# Patient Record
Sex: Male | Born: 1945 | Race: White | Hispanic: No | Marital: Single | Smoking: Current every day smoker
Health system: Southern US, Community
[De-identification: ages and names within clinical notes are randomized; demographics above are authoritative.]

## PROBLEM LIST (undated history)

## (undated) DIAGNOSIS — I10 Essential (primary) hypertension: Secondary | ICD-10-CM

## (undated) DIAGNOSIS — E119 Type 2 diabetes mellitus without complications: Secondary | ICD-10-CM

## (undated) DIAGNOSIS — J449 Chronic obstructive pulmonary disease, unspecified: Secondary | ICD-10-CM

## (undated) DIAGNOSIS — N200 Calculus of kidney: Secondary | ICD-10-CM

## (undated) DIAGNOSIS — I2510 Atherosclerotic heart disease of native coronary artery without angina pectoris: Secondary | ICD-10-CM

## (undated) DIAGNOSIS — K219 Gastro-esophageal reflux disease without esophagitis: Secondary | ICD-10-CM

---

## 2007-08-01 ENCOUNTER — Ambulatory Visit: Payer: Self-pay | Admitting: Cardiology

## 2009-07-22 ENCOUNTER — Ambulatory Visit: Payer: Self-pay | Admitting: Cardiology

## 2011-03-13 DIAGNOSIS — Z79899 Other long term (current) drug therapy: Secondary | ICD-10-CM | POA: Diagnosis not present

## 2011-03-13 DIAGNOSIS — G8929 Other chronic pain: Secondary | ICD-10-CM | POA: Diagnosis not present

## 2011-03-13 DIAGNOSIS — R5381 Other malaise: Secondary | ICD-10-CM | POA: Diagnosis not present

## 2011-03-13 DIAGNOSIS — I10 Essential (primary) hypertension: Secondary | ICD-10-CM | POA: Diagnosis not present

## 2011-03-13 DIAGNOSIS — R5383 Other fatigue: Secondary | ICD-10-CM | POA: Diagnosis not present

## 2011-03-13 DIAGNOSIS — Z7982 Long term (current) use of aspirin: Secondary | ICD-10-CM | POA: Diagnosis not present

## 2011-03-13 DIAGNOSIS — K449 Diaphragmatic hernia without obstruction or gangrene: Secondary | ICD-10-CM | POA: Diagnosis not present

## 2011-03-13 DIAGNOSIS — R079 Chest pain, unspecified: Secondary | ICD-10-CM | POA: Diagnosis not present

## 2011-03-13 DIAGNOSIS — N390 Urinary tract infection, site not specified: Secondary | ICD-10-CM | POA: Diagnosis not present

## 2011-03-13 DIAGNOSIS — M549 Dorsalgia, unspecified: Secondary | ICD-10-CM | POA: Diagnosis not present

## 2011-03-13 DIAGNOSIS — E860 Dehydration: Secondary | ICD-10-CM | POA: Diagnosis not present

## 2011-03-13 DIAGNOSIS — J449 Chronic obstructive pulmonary disease, unspecified: Secondary | ICD-10-CM | POA: Diagnosis not present

## 2011-03-13 DIAGNOSIS — E119 Type 2 diabetes mellitus without complications: Secondary | ICD-10-CM | POA: Diagnosis not present

## 2011-03-13 DIAGNOSIS — F17200 Nicotine dependence, unspecified, uncomplicated: Secondary | ICD-10-CM | POA: Diagnosis not present

## 2011-03-13 DIAGNOSIS — E785 Hyperlipidemia, unspecified: Secondary | ICD-10-CM | POA: Diagnosis not present

## 2011-03-13 DIAGNOSIS — Z8673 Personal history of transient ischemic attack (TIA), and cerebral infarction without residual deficits: Secondary | ICD-10-CM | POA: Diagnosis not present

## 2011-03-14 DIAGNOSIS — J449 Chronic obstructive pulmonary disease, unspecified: Secondary | ICD-10-CM | POA: Diagnosis not present

## 2011-03-14 DIAGNOSIS — Z8673 Personal history of transient ischemic attack (TIA), and cerebral infarction without residual deficits: Secondary | ICD-10-CM | POA: Diagnosis not present

## 2011-03-14 DIAGNOSIS — N390 Urinary tract infection, site not specified: Secondary | ICD-10-CM | POA: Diagnosis not present

## 2011-03-14 DIAGNOSIS — M549 Dorsalgia, unspecified: Secondary | ICD-10-CM | POA: Diagnosis not present

## 2011-03-14 DIAGNOSIS — E119 Type 2 diabetes mellitus without complications: Secondary | ICD-10-CM | POA: Diagnosis not present

## 2011-03-14 DIAGNOSIS — E860 Dehydration: Secondary | ICD-10-CM | POA: Diagnosis not present

## 2011-03-15 DIAGNOSIS — N390 Urinary tract infection, site not specified: Secondary | ICD-10-CM | POA: Diagnosis not present

## 2011-03-15 DIAGNOSIS — E119 Type 2 diabetes mellitus without complications: Secondary | ICD-10-CM | POA: Diagnosis not present

## 2011-03-15 DIAGNOSIS — J449 Chronic obstructive pulmonary disease, unspecified: Secondary | ICD-10-CM | POA: Diagnosis not present

## 2011-03-15 DIAGNOSIS — Z8673 Personal history of transient ischemic attack (TIA), and cerebral infarction without residual deficits: Secondary | ICD-10-CM | POA: Diagnosis not present

## 2011-03-15 DIAGNOSIS — M549 Dorsalgia, unspecified: Secondary | ICD-10-CM | POA: Diagnosis not present

## 2011-03-15 DIAGNOSIS — E860 Dehydration: Secondary | ICD-10-CM | POA: Diagnosis not present

## 2011-03-16 DIAGNOSIS — E860 Dehydration: Secondary | ICD-10-CM | POA: Diagnosis not present

## 2011-03-16 DIAGNOSIS — N390 Urinary tract infection, site not specified: Secondary | ICD-10-CM | POA: Diagnosis not present

## 2011-03-16 DIAGNOSIS — J449 Chronic obstructive pulmonary disease, unspecified: Secondary | ICD-10-CM | POA: Diagnosis not present

## 2011-03-16 DIAGNOSIS — M549 Dorsalgia, unspecified: Secondary | ICD-10-CM | POA: Diagnosis not present

## 2011-03-16 DIAGNOSIS — Z8673 Personal history of transient ischemic attack (TIA), and cerebral infarction without residual deficits: Secondary | ICD-10-CM | POA: Diagnosis not present

## 2011-03-16 DIAGNOSIS — E119 Type 2 diabetes mellitus without complications: Secondary | ICD-10-CM | POA: Diagnosis not present

## 2011-04-03 DIAGNOSIS — G609 Hereditary and idiopathic neuropathy, unspecified: Secondary | ICD-10-CM | POA: Diagnosis not present

## 2011-04-03 DIAGNOSIS — M549 Dorsalgia, unspecified: Secondary | ICD-10-CM | POA: Diagnosis not present

## 2011-04-03 DIAGNOSIS — K219 Gastro-esophageal reflux disease without esophagitis: Secondary | ICD-10-CM | POA: Diagnosis not present

## 2011-04-03 DIAGNOSIS — J449 Chronic obstructive pulmonary disease, unspecified: Secondary | ICD-10-CM | POA: Diagnosis not present

## 2011-04-03 DIAGNOSIS — I6529 Occlusion and stenosis of unspecified carotid artery: Secondary | ICD-10-CM | POA: Diagnosis not present

## 2011-04-03 DIAGNOSIS — F411 Generalized anxiety disorder: Secondary | ICD-10-CM | POA: Diagnosis not present

## 2011-06-01 DIAGNOSIS — F411 Generalized anxiety disorder: Secondary | ICD-10-CM | POA: Diagnosis not present

## 2011-06-01 DIAGNOSIS — Z833 Family history of diabetes mellitus: Secondary | ICD-10-CM | POA: Diagnosis not present

## 2011-06-01 DIAGNOSIS — I6529 Occlusion and stenosis of unspecified carotid artery: Secondary | ICD-10-CM | POA: Diagnosis not present

## 2011-06-01 DIAGNOSIS — E785 Hyperlipidemia, unspecified: Secondary | ICD-10-CM | POA: Diagnosis not present

## 2011-06-01 DIAGNOSIS — J449 Chronic obstructive pulmonary disease, unspecified: Secondary | ICD-10-CM | POA: Diagnosis not present

## 2011-06-01 DIAGNOSIS — E119 Type 2 diabetes mellitus without complications: Secondary | ICD-10-CM | POA: Diagnosis not present

## 2011-06-01 DIAGNOSIS — E782 Mixed hyperlipidemia: Secondary | ICD-10-CM | POA: Diagnosis not present

## 2011-06-01 DIAGNOSIS — K219 Gastro-esophageal reflux disease without esophagitis: Secondary | ICD-10-CM | POA: Diagnosis not present

## 2011-06-01 DIAGNOSIS — M549 Dorsalgia, unspecified: Secondary | ICD-10-CM | POA: Diagnosis not present

## 2011-06-01 DIAGNOSIS — G609 Hereditary and idiopathic neuropathy, unspecified: Secondary | ICD-10-CM | POA: Diagnosis not present

## 2011-06-18 DIAGNOSIS — M545 Low back pain, unspecified: Secondary | ICD-10-CM | POA: Diagnosis not present

## 2011-06-18 DIAGNOSIS — Z7982 Long term (current) use of aspirin: Secondary | ICD-10-CM | POA: Diagnosis not present

## 2011-06-18 DIAGNOSIS — E119 Type 2 diabetes mellitus without complications: Secondary | ICD-10-CM | POA: Diagnosis not present

## 2011-06-18 DIAGNOSIS — I714 Abdominal aortic aneurysm, without rupture: Secondary | ICD-10-CM | POA: Diagnosis not present

## 2011-06-18 DIAGNOSIS — G8929 Other chronic pain: Secondary | ICD-10-CM | POA: Diagnosis not present

## 2011-06-18 DIAGNOSIS — N281 Cyst of kidney, acquired: Secondary | ICD-10-CM | POA: Diagnosis not present

## 2011-06-18 DIAGNOSIS — Z79899 Other long term (current) drug therapy: Secondary | ICD-10-CM | POA: Diagnosis not present

## 2011-06-18 DIAGNOSIS — M549 Dorsalgia, unspecified: Secondary | ICD-10-CM | POA: Diagnosis not present

## 2011-06-18 DIAGNOSIS — F17200 Nicotine dependence, unspecified, uncomplicated: Secondary | ICD-10-CM | POA: Diagnosis not present

## 2011-06-18 DIAGNOSIS — J449 Chronic obstructive pulmonary disease, unspecified: Secondary | ICD-10-CM | POA: Diagnosis not present

## 2011-06-18 DIAGNOSIS — N200 Calculus of kidney: Secondary | ICD-10-CM | POA: Diagnosis not present

## 2011-06-18 DIAGNOSIS — R109 Unspecified abdominal pain: Secondary | ICD-10-CM | POA: Diagnosis not present

## 2011-07-16 DIAGNOSIS — I10 Essential (primary) hypertension: Secondary | ICD-10-CM | POA: Diagnosis not present

## 2011-07-16 DIAGNOSIS — G319 Degenerative disease of nervous system, unspecified: Secondary | ICD-10-CM | POA: Diagnosis not present

## 2011-07-16 DIAGNOSIS — F17200 Nicotine dependence, unspecified, uncomplicated: Secondary | ICD-10-CM | POA: Diagnosis not present

## 2011-07-16 DIAGNOSIS — I6529 Occlusion and stenosis of unspecified carotid artery: Secondary | ICD-10-CM | POA: Diagnosis not present

## 2011-07-16 DIAGNOSIS — Z2821 Immunization not carried out because of patient refusal: Secondary | ICD-10-CM | POA: Diagnosis not present

## 2011-07-16 DIAGNOSIS — K449 Diaphragmatic hernia without obstruction or gangrene: Secondary | ICD-10-CM | POA: Diagnosis not present

## 2011-07-16 DIAGNOSIS — Z79899 Other long term (current) drug therapy: Secondary | ICD-10-CM | POA: Diagnosis not present

## 2011-07-16 DIAGNOSIS — M545 Low back pain, unspecified: Secondary | ICD-10-CM | POA: Diagnosis not present

## 2011-07-16 DIAGNOSIS — IMO0002 Reserved for concepts with insufficient information to code with codable children: Secondary | ICD-10-CM | POA: Diagnosis not present

## 2011-07-16 DIAGNOSIS — N289 Disorder of kidney and ureter, unspecified: Secondary | ICD-10-CM | POA: Diagnosis not present

## 2011-07-16 DIAGNOSIS — G459 Transient cerebral ischemic attack, unspecified: Secondary | ICD-10-CM | POA: Diagnosis not present

## 2011-07-16 DIAGNOSIS — E785 Hyperlipidemia, unspecified: Secondary | ICD-10-CM | POA: Diagnosis not present

## 2011-07-16 DIAGNOSIS — E119 Type 2 diabetes mellitus without complications: Secondary | ICD-10-CM | POA: Diagnosis not present

## 2011-07-16 DIAGNOSIS — G8929 Other chronic pain: Secondary | ICD-10-CM | POA: Diagnosis not present

## 2011-07-16 DIAGNOSIS — J449 Chronic obstructive pulmonary disease, unspecified: Secondary | ICD-10-CM | POA: Diagnosis not present

## 2011-07-16 DIAGNOSIS — Z7982 Long term (current) use of aspirin: Secondary | ICD-10-CM | POA: Diagnosis not present

## 2011-07-17 DIAGNOSIS — N289 Disorder of kidney and ureter, unspecified: Secondary | ICD-10-CM | POA: Diagnosis not present

## 2011-07-17 DIAGNOSIS — G459 Transient cerebral ischemic attack, unspecified: Secondary | ICD-10-CM | POA: Diagnosis not present

## 2011-07-17 DIAGNOSIS — I10 Essential (primary) hypertension: Secondary | ICD-10-CM | POA: Diagnosis not present

## 2011-07-17 DIAGNOSIS — E119 Type 2 diabetes mellitus without complications: Secondary | ICD-10-CM | POA: Diagnosis not present

## 2011-07-17 DIAGNOSIS — E785 Hyperlipidemia, unspecified: Secondary | ICD-10-CM | POA: Diagnosis not present

## 2011-07-17 DIAGNOSIS — J449 Chronic obstructive pulmonary disease, unspecified: Secondary | ICD-10-CM | POA: Diagnosis not present

## 2011-08-10 DIAGNOSIS — E1149 Type 2 diabetes mellitus with other diabetic neurological complication: Secondary | ICD-10-CM | POA: Diagnosis not present

## 2011-10-13 DIAGNOSIS — E782 Mixed hyperlipidemia: Secondary | ICD-10-CM | POA: Diagnosis not present

## 2011-10-13 DIAGNOSIS — E119 Type 2 diabetes mellitus without complications: Secondary | ICD-10-CM | POA: Diagnosis not present

## 2011-10-13 DIAGNOSIS — M549 Dorsalgia, unspecified: Secondary | ICD-10-CM | POA: Diagnosis not present

## 2011-11-27 DIAGNOSIS — E119 Type 2 diabetes mellitus without complications: Secondary | ICD-10-CM | POA: Diagnosis not present

## 2011-12-03 DIAGNOSIS — Z7982 Long term (current) use of aspirin: Secondary | ICD-10-CM | POA: Diagnosis not present

## 2011-12-03 DIAGNOSIS — F17200 Nicotine dependence, unspecified, uncomplicated: Secondary | ICD-10-CM | POA: Diagnosis not present

## 2011-12-03 DIAGNOSIS — Z79899 Other long term (current) drug therapy: Secondary | ICD-10-CM | POA: Diagnosis not present

## 2011-12-03 DIAGNOSIS — E119 Type 2 diabetes mellitus without complications: Secondary | ICD-10-CM | POA: Diagnosis not present

## 2011-12-03 DIAGNOSIS — N200 Calculus of kidney: Secondary | ICD-10-CM | POA: Diagnosis not present

## 2011-12-03 DIAGNOSIS — R109 Unspecified abdominal pain: Secondary | ICD-10-CM | POA: Diagnosis not present

## 2011-12-03 DIAGNOSIS — J449 Chronic obstructive pulmonary disease, unspecified: Secondary | ICD-10-CM | POA: Diagnosis not present

## 2011-12-08 DIAGNOSIS — N23 Unspecified renal colic: Secondary | ICD-10-CM | POA: Diagnosis not present

## 2011-12-23 ENCOUNTER — Ambulatory Visit (HOSPITAL_COMMUNITY)
Admission: RE | Admit: 2011-12-23 | Discharge: 2011-12-23 | Disposition: A | Discharge status: Home / Self Care | Payer: Medicare Other | Source: Ambulatory Visit | Attending: Urology | Admitting: Urology

## 2011-12-23 ENCOUNTER — Other Ambulatory Visit (HOSPITAL_COMMUNITY): Payer: Self-pay | Admitting: Urology

## 2011-12-23 DIAGNOSIS — N200 Calculus of kidney: Secondary | ICD-10-CM

## 2011-12-23 DIAGNOSIS — R1032 Left lower quadrant pain: Secondary | ICD-10-CM | POA: Diagnosis not present

## 2011-12-23 DIAGNOSIS — N320 Bladder-neck obstruction: Secondary | ICD-10-CM | POA: Diagnosis not present

## 2011-12-23 DIAGNOSIS — N23 Unspecified renal colic: Secondary | ICD-10-CM | POA: Diagnosis not present

## 2011-12-29 DIAGNOSIS — Z79899 Other long term (current) drug therapy: Secondary | ICD-10-CM | POA: Diagnosis not present

## 2011-12-29 DIAGNOSIS — E119 Type 2 diabetes mellitus without complications: Secondary | ICD-10-CM | POA: Diagnosis not present

## 2011-12-29 DIAGNOSIS — F17200 Nicotine dependence, unspecified, uncomplicated: Secondary | ICD-10-CM | POA: Diagnosis not present

## 2011-12-29 DIAGNOSIS — M129 Arthropathy, unspecified: Secondary | ICD-10-CM | POA: Diagnosis not present

## 2011-12-29 DIAGNOSIS — J449 Chronic obstructive pulmonary disease, unspecified: Secondary | ICD-10-CM | POA: Diagnosis not present

## 2011-12-29 DIAGNOSIS — N323 Diverticulum of bladder: Secondary | ICD-10-CM | POA: Diagnosis not present

## 2011-12-29 DIAGNOSIS — N200 Calculus of kidney: Secondary | ICD-10-CM | POA: Diagnosis not present

## 2012-01-06 DIAGNOSIS — N200 Calculus of kidney: Secondary | ICD-10-CM | POA: Diagnosis not present

## 2012-02-08 DIAGNOSIS — M549 Dorsalgia, unspecified: Secondary | ICD-10-CM | POA: Diagnosis not present

## 2012-04-08 DIAGNOSIS — E782 Mixed hyperlipidemia: Secondary | ICD-10-CM | POA: Diagnosis not present

## 2012-04-08 DIAGNOSIS — E119 Type 2 diabetes mellitus without complications: Secondary | ICD-10-CM | POA: Diagnosis not present

## 2012-04-08 DIAGNOSIS — M549 Dorsalgia, unspecified: Secondary | ICD-10-CM | POA: Diagnosis not present

## 2012-06-02 DIAGNOSIS — G43909 Migraine, unspecified, not intractable, without status migrainosus: Secondary | ICD-10-CM | POA: Diagnosis not present

## 2012-08-05 DIAGNOSIS — M549 Dorsalgia, unspecified: Secondary | ICD-10-CM | POA: Diagnosis not present

## 2012-10-03 DIAGNOSIS — Z125 Encounter for screening for malignant neoplasm of prostate: Secondary | ICD-10-CM | POA: Diagnosis not present

## 2012-10-03 DIAGNOSIS — M549 Dorsalgia, unspecified: Secondary | ICD-10-CM | POA: Diagnosis not present

## 2012-10-03 DIAGNOSIS — Z0000 Encounter for general adult medical examination without abnormal findings: Secondary | ICD-10-CM | POA: Diagnosis not present

## 2012-11-20 DIAGNOSIS — E669 Obesity, unspecified: Secondary | ICD-10-CM | POA: Diagnosis not present

## 2012-11-20 DIAGNOSIS — E119 Type 2 diabetes mellitus without complications: Secondary | ICD-10-CM | POA: Diagnosis not present

## 2012-11-20 DIAGNOSIS — Y92009 Unspecified place in unspecified non-institutional (private) residence as the place of occurrence of the external cause: Secondary | ICD-10-CM | POA: Diagnosis not present

## 2012-11-20 DIAGNOSIS — Z79899 Other long term (current) drug therapy: Secondary | ICD-10-CM | POA: Diagnosis not present

## 2012-11-20 DIAGNOSIS — J449 Chronic obstructive pulmonary disease, unspecified: Secondary | ICD-10-CM | POA: Diagnosis not present

## 2012-11-20 DIAGNOSIS — S61209A Unspecified open wound of unspecified finger without damage to nail, initial encounter: Secondary | ICD-10-CM | POA: Diagnosis not present

## 2012-11-20 DIAGNOSIS — F17200 Nicotine dependence, unspecified, uncomplicated: Secondary | ICD-10-CM | POA: Diagnosis not present

## 2012-11-20 DIAGNOSIS — Z23 Encounter for immunization: Secondary | ICD-10-CM | POA: Diagnosis not present

## 2012-11-20 DIAGNOSIS — W268XXA Contact with other sharp object(s), not elsewhere classified, initial encounter: Secondary | ICD-10-CM | POA: Diagnosis not present

## 2012-11-20 DIAGNOSIS — Z7982 Long term (current) use of aspirin: Secondary | ICD-10-CM | POA: Diagnosis not present

## 2012-12-05 DIAGNOSIS — E119 Type 2 diabetes mellitus without complications: Secondary | ICD-10-CM | POA: Diagnosis not present

## 2012-12-05 DIAGNOSIS — M549 Dorsalgia, unspecified: Secondary | ICD-10-CM | POA: Diagnosis not present

## 2013-01-03 DIAGNOSIS — E119 Type 2 diabetes mellitus without complications: Secondary | ICD-10-CM | POA: Diagnosis not present

## 2013-02-06 DIAGNOSIS — E119 Type 2 diabetes mellitus without complications: Secondary | ICD-10-CM | POA: Diagnosis not present

## 2013-02-06 DIAGNOSIS — M549 Dorsalgia, unspecified: Secondary | ICD-10-CM | POA: Diagnosis not present

## 2013-04-07 DIAGNOSIS — I10 Essential (primary) hypertension: Secondary | ICD-10-CM | POA: Diagnosis not present

## 2013-04-07 DIAGNOSIS — M549 Dorsalgia, unspecified: Secondary | ICD-10-CM | POA: Diagnosis not present

## 2013-04-07 DIAGNOSIS — E119 Type 2 diabetes mellitus without complications: Secondary | ICD-10-CM | POA: Diagnosis not present

## 2013-05-08 DIAGNOSIS — M810 Age-related osteoporosis without current pathological fracture: Secondary | ICD-10-CM | POA: Diagnosis not present

## 2013-05-08 DIAGNOSIS — M899 Disorder of bone, unspecified: Secondary | ICD-10-CM | POA: Diagnosis not present

## 2013-05-08 DIAGNOSIS — M949 Disorder of cartilage, unspecified: Secondary | ICD-10-CM | POA: Diagnosis not present

## 2013-06-01 DIAGNOSIS — M549 Dorsalgia, unspecified: Secondary | ICD-10-CM | POA: Diagnosis not present

## 2013-06-01 DIAGNOSIS — I10 Essential (primary) hypertension: Secondary | ICD-10-CM | POA: Diagnosis not present

## 2013-07-18 DIAGNOSIS — N400 Enlarged prostate without lower urinary tract symptoms: Secondary | ICD-10-CM | POA: Diagnosis not present

## 2013-07-18 DIAGNOSIS — Z87442 Personal history of urinary calculi: Secondary | ICD-10-CM | POA: Diagnosis not present

## 2013-07-18 DIAGNOSIS — Z79899 Other long term (current) drug therapy: Secondary | ICD-10-CM | POA: Diagnosis not present

## 2013-07-18 DIAGNOSIS — Z1211 Encounter for screening for malignant neoplasm of colon: Secondary | ICD-10-CM | POA: Diagnosis not present

## 2013-07-18 DIAGNOSIS — F17200 Nicotine dependence, unspecified, uncomplicated: Secondary | ICD-10-CM | POA: Diagnosis not present

## 2013-07-18 DIAGNOSIS — G8929 Other chronic pain: Secondary | ICD-10-CM | POA: Diagnosis not present

## 2013-07-18 DIAGNOSIS — E669 Obesity, unspecified: Secondary | ICD-10-CM | POA: Diagnosis not present

## 2013-07-18 DIAGNOSIS — Z8489 Family history of other specified conditions: Secondary | ICD-10-CM | POA: Diagnosis not present

## 2013-07-18 DIAGNOSIS — I10 Essential (primary) hypertension: Secondary | ICD-10-CM | POA: Diagnosis not present

## 2013-07-18 DIAGNOSIS — E119 Type 2 diabetes mellitus without complications: Secondary | ICD-10-CM | POA: Diagnosis not present

## 2013-07-18 DIAGNOSIS — K5730 Diverticulosis of large intestine without perforation or abscess without bleeding: Secondary | ICD-10-CM | POA: Diagnosis not present

## 2013-07-18 DIAGNOSIS — Z6834 Body mass index (BMI) 34.0-34.9, adult: Secondary | ICD-10-CM | POA: Diagnosis not present

## 2013-07-18 DIAGNOSIS — J449 Chronic obstructive pulmonary disease, unspecified: Secondary | ICD-10-CM | POA: Diagnosis not present

## 2013-07-18 DIAGNOSIS — E785 Hyperlipidemia, unspecified: Secondary | ICD-10-CM | POA: Diagnosis not present

## 2013-07-18 DIAGNOSIS — M549 Dorsalgia, unspecified: Secondary | ICD-10-CM | POA: Diagnosis not present

## 2013-07-18 DIAGNOSIS — Z7982 Long term (current) use of aspirin: Secondary | ICD-10-CM | POA: Diagnosis not present

## 2013-07-18 DIAGNOSIS — G589 Mononeuropathy, unspecified: Secondary | ICD-10-CM | POA: Diagnosis not present

## 2013-08-04 DIAGNOSIS — M549 Dorsalgia, unspecified: Secondary | ICD-10-CM | POA: Diagnosis not present

## 2013-10-12 DIAGNOSIS — Z125 Encounter for screening for malignant neoplasm of prostate: Secondary | ICD-10-CM | POA: Diagnosis not present

## 2013-10-12 DIAGNOSIS — I10 Essential (primary) hypertension: Secondary | ICD-10-CM | POA: Diagnosis not present

## 2013-10-12 DIAGNOSIS — E119 Type 2 diabetes mellitus without complications: Secondary | ICD-10-CM | POA: Diagnosis not present

## 2013-10-12 DIAGNOSIS — M545 Low back pain, unspecified: Secondary | ICD-10-CM | POA: Diagnosis not present

## 2013-10-12 DIAGNOSIS — Z5181 Encounter for therapeutic drug level monitoring: Secondary | ICD-10-CM | POA: Diagnosis not present

## 2013-10-12 DIAGNOSIS — L570 Actinic keratosis: Secondary | ICD-10-CM | POA: Diagnosis not present

## 2013-10-12 DIAGNOSIS — G43909 Migraine, unspecified, not intractable, without status migrainosus: Secondary | ICD-10-CM | POA: Diagnosis not present

## 2013-10-12 DIAGNOSIS — M549 Dorsalgia, unspecified: Secondary | ICD-10-CM | POA: Diagnosis not present

## 2013-10-27 DIAGNOSIS — I739 Peripheral vascular disease, unspecified: Secondary | ICD-10-CM | POA: Diagnosis not present

## 2013-10-27 DIAGNOSIS — I10 Essential (primary) hypertension: Secondary | ICD-10-CM | POA: Diagnosis not present

## 2013-10-27 DIAGNOSIS — E119 Type 2 diabetes mellitus without complications: Secondary | ICD-10-CM | POA: Diagnosis not present

## 2013-10-27 DIAGNOSIS — Z9889 Other specified postprocedural states: Secondary | ICD-10-CM | POA: Diagnosis not present

## 2013-10-27 DIAGNOSIS — R209 Unspecified disturbances of skin sensation: Secondary | ICD-10-CM | POA: Diagnosis not present

## 2013-10-27 DIAGNOSIS — E669 Obesity, unspecified: Secondary | ICD-10-CM | POA: Diagnosis not present

## 2013-10-27 DIAGNOSIS — F17200 Nicotine dependence, unspecified, uncomplicated: Secondary | ICD-10-CM | POA: Diagnosis not present

## 2013-11-02 DIAGNOSIS — J449 Chronic obstructive pulmonary disease, unspecified: Secondary | ICD-10-CM | POA: Diagnosis present

## 2013-11-02 DIAGNOSIS — J9819 Other pulmonary collapse: Secondary | ICD-10-CM | POA: Diagnosis not present

## 2013-11-02 DIAGNOSIS — Z7982 Long term (current) use of aspirin: Secondary | ICD-10-CM | POA: Diagnosis not present

## 2013-11-02 DIAGNOSIS — E860 Dehydration: Secondary | ICD-10-CM | POA: Diagnosis present

## 2013-11-02 DIAGNOSIS — I723 Aneurysm of iliac artery: Secondary | ICD-10-CM | POA: Diagnosis not present

## 2013-11-02 DIAGNOSIS — G8929 Other chronic pain: Secondary | ICD-10-CM | POA: Diagnosis present

## 2013-11-02 DIAGNOSIS — K5900 Constipation, unspecified: Secondary | ICD-10-CM | POA: Diagnosis present

## 2013-11-02 DIAGNOSIS — I10 Essential (primary) hypertension: Secondary | ICD-10-CM | POA: Diagnosis present

## 2013-11-02 DIAGNOSIS — A419 Sepsis, unspecified organism: Secondary | ICD-10-CM | POA: Diagnosis not present

## 2013-11-02 DIAGNOSIS — F17200 Nicotine dependence, unspecified, uncomplicated: Secondary | ICD-10-CM | POA: Diagnosis present

## 2013-11-02 DIAGNOSIS — K449 Diaphragmatic hernia without obstruction or gangrene: Secondary | ICD-10-CM | POA: Diagnosis present

## 2013-11-02 DIAGNOSIS — N390 Urinary tract infection, site not specified: Secondary | ICD-10-CM | POA: Diagnosis not present

## 2013-11-02 DIAGNOSIS — Z8673 Personal history of transient ischemic attack (TIA), and cerebral infarction without residual deficits: Secondary | ICD-10-CM | POA: Diagnosis not present

## 2013-11-02 DIAGNOSIS — E119 Type 2 diabetes mellitus without complications: Secondary | ICD-10-CM | POA: Diagnosis not present

## 2013-11-02 DIAGNOSIS — R42 Dizziness and giddiness: Secondary | ICD-10-CM | POA: Diagnosis not present

## 2013-11-02 DIAGNOSIS — M545 Low back pain, unspecified: Secondary | ICD-10-CM | POA: Diagnosis present

## 2013-11-02 DIAGNOSIS — R55 Syncope and collapse: Secondary | ICD-10-CM | POA: Diagnosis not present

## 2013-11-02 DIAGNOSIS — A498 Other bacterial infections of unspecified site: Secondary | ICD-10-CM | POA: Diagnosis not present

## 2013-11-02 DIAGNOSIS — J189 Pneumonia, unspecified organism: Secondary | ICD-10-CM | POA: Diagnosis not present

## 2013-11-02 DIAGNOSIS — E785 Hyperlipidemia, unspecified: Secondary | ICD-10-CM | POA: Diagnosis present

## 2013-11-02 DIAGNOSIS — Z79899 Other long term (current) drug therapy: Secondary | ICD-10-CM | POA: Diagnosis not present

## 2013-11-02 DIAGNOSIS — R51 Headache: Secondary | ICD-10-CM | POA: Diagnosis not present

## 2013-11-02 DIAGNOSIS — I700 Atherosclerosis of aorta: Secondary | ICD-10-CM | POA: Diagnosis not present

## 2013-11-02 DIAGNOSIS — I959 Hypotension, unspecified: Secondary | ICD-10-CM | POA: Diagnosis not present

## 2013-11-10 DIAGNOSIS — G43909 Migraine, unspecified, not intractable, without status migrainosus: Secondary | ICD-10-CM | POA: Diagnosis not present

## 2013-11-10 DIAGNOSIS — J151 Pneumonia due to Pseudomonas: Secondary | ICD-10-CM | POA: Diagnosis not present

## 2013-12-04 DIAGNOSIS — E119 Type 2 diabetes mellitus without complications: Secondary | ICD-10-CM | POA: Diagnosis not present

## 2013-12-04 DIAGNOSIS — I10 Essential (primary) hypertension: Secondary | ICD-10-CM | POA: Diagnosis not present

## 2013-12-04 DIAGNOSIS — Z1389 Encounter for screening for other disorder: Secondary | ICD-10-CM | POA: Diagnosis not present

## 2013-12-04 DIAGNOSIS — E782 Mixed hyperlipidemia: Secondary | ICD-10-CM | POA: Diagnosis not present

## 2013-12-04 DIAGNOSIS — Z0000 Encounter for general adult medical examination without abnormal findings: Secondary | ICD-10-CM | POA: Diagnosis not present

## 2014-02-02 DIAGNOSIS — J209 Acute bronchitis, unspecified: Secondary | ICD-10-CM | POA: Diagnosis not present

## 2014-02-02 DIAGNOSIS — I10 Essential (primary) hypertension: Secondary | ICD-10-CM | POA: Diagnosis not present

## 2014-02-02 DIAGNOSIS — E1149 Type 2 diabetes mellitus with other diabetic neurological complication: Secondary | ICD-10-CM | POA: Diagnosis not present

## 2014-04-05 DIAGNOSIS — I10 Essential (primary) hypertension: Secondary | ICD-10-CM | POA: Diagnosis not present

## 2014-04-05 DIAGNOSIS — E782 Mixed hyperlipidemia: Secondary | ICD-10-CM | POA: Diagnosis not present

## 2014-04-05 DIAGNOSIS — E1149 Type 2 diabetes mellitus with other diabetic neurological complication: Secondary | ICD-10-CM | POA: Diagnosis not present

## 2014-06-04 DIAGNOSIS — I10 Essential (primary) hypertension: Secondary | ICD-10-CM | POA: Diagnosis not present

## 2014-06-04 DIAGNOSIS — R5383 Other fatigue: Secondary | ICD-10-CM | POA: Diagnosis not present

## 2014-06-04 DIAGNOSIS — E1149 Type 2 diabetes mellitus with other diabetic neurological complication: Secondary | ICD-10-CM | POA: Diagnosis not present

## 2014-06-04 DIAGNOSIS — E782 Mixed hyperlipidemia: Secondary | ICD-10-CM | POA: Diagnosis not present

## 2014-06-04 DIAGNOSIS — E559 Vitamin D deficiency, unspecified: Secondary | ICD-10-CM | POA: Diagnosis not present

## 2014-08-03 DIAGNOSIS — I10 Essential (primary) hypertension: Secondary | ICD-10-CM | POA: Diagnosis not present

## 2014-08-03 DIAGNOSIS — E1149 Type 2 diabetes mellitus with other diabetic neurological complication: Secondary | ICD-10-CM | POA: Diagnosis not present

## 2014-08-03 DIAGNOSIS — M545 Low back pain: Secondary | ICD-10-CM | POA: Diagnosis not present

## 2014-08-03 DIAGNOSIS — E782 Mixed hyperlipidemia: Secondary | ICD-10-CM | POA: Diagnosis not present

## 2014-10-04 DIAGNOSIS — M545 Low back pain: Secondary | ICD-10-CM | POA: Diagnosis not present

## 2014-10-04 DIAGNOSIS — E1149 Type 2 diabetes mellitus with other diabetic neurological complication: Secondary | ICD-10-CM | POA: Diagnosis not present

## 2014-10-04 DIAGNOSIS — I10 Essential (primary) hypertension: Secondary | ICD-10-CM | POA: Diagnosis not present

## 2014-10-04 DIAGNOSIS — E782 Mixed hyperlipidemia: Secondary | ICD-10-CM | POA: Diagnosis not present

## 2014-10-09 DIAGNOSIS — M5126 Other intervertebral disc displacement, lumbar region: Secondary | ICD-10-CM | POA: Diagnosis not present

## 2014-10-09 DIAGNOSIS — I714 Abdominal aortic aneurysm, without rupture: Secondary | ICD-10-CM | POA: Diagnosis not present

## 2014-10-09 DIAGNOSIS — I700 Atherosclerosis of aorta: Secondary | ICD-10-CM | POA: Diagnosis not present

## 2014-10-09 DIAGNOSIS — M47816 Spondylosis without myelopathy or radiculopathy, lumbar region: Secondary | ICD-10-CM | POA: Diagnosis not present

## 2014-10-09 DIAGNOSIS — M545 Low back pain: Secondary | ICD-10-CM | POA: Diagnosis not present

## 2014-11-13 DIAGNOSIS — M545 Low back pain: Secondary | ICD-10-CM | POA: Diagnosis present

## 2014-11-13 DIAGNOSIS — I2510 Atherosclerotic heart disease of native coronary artery without angina pectoris: Secondary | ICD-10-CM | POA: Diagnosis present

## 2014-11-13 DIAGNOSIS — R2689 Other abnormalities of gait and mobility: Secondary | ICD-10-CM | POA: Diagnosis not present

## 2014-11-13 DIAGNOSIS — Z7982 Long term (current) use of aspirin: Secondary | ICD-10-CM | POA: Diagnosis not present

## 2014-11-13 DIAGNOSIS — E860 Dehydration: Secondary | ICD-10-CM | POA: Diagnosis not present

## 2014-11-13 DIAGNOSIS — Z79891 Long term (current) use of opiate analgesic: Secondary | ICD-10-CM | POA: Diagnosis not present

## 2014-11-13 DIAGNOSIS — K449 Diaphragmatic hernia without obstruction or gangrene: Secondary | ICD-10-CM | POA: Diagnosis present

## 2014-11-13 DIAGNOSIS — G43909 Migraine, unspecified, not intractable, without status migrainosus: Secondary | ICD-10-CM | POA: Diagnosis not present

## 2014-11-13 DIAGNOSIS — E875 Hyperkalemia: Secondary | ICD-10-CM | POA: Diagnosis not present

## 2014-11-13 DIAGNOSIS — J449 Chronic obstructive pulmonary disease, unspecified: Secondary | ICD-10-CM | POA: Diagnosis present

## 2014-11-13 DIAGNOSIS — F419 Anxiety disorder, unspecified: Secondary | ICD-10-CM | POA: Diagnosis not present

## 2014-11-13 DIAGNOSIS — F17200 Nicotine dependence, unspecified, uncomplicated: Secondary | ICD-10-CM | POA: Diagnosis present

## 2014-11-13 DIAGNOSIS — E785 Hyperlipidemia, unspecified: Secondary | ICD-10-CM | POA: Diagnosis present

## 2014-11-13 DIAGNOSIS — I10 Essential (primary) hypertension: Secondary | ICD-10-CM | POA: Diagnosis not present

## 2014-11-13 DIAGNOSIS — M6281 Muscle weakness (generalized): Secondary | ICD-10-CM | POA: Diagnosis not present

## 2014-11-13 DIAGNOSIS — A419 Sepsis, unspecified organism: Secondary | ICD-10-CM | POA: Diagnosis not present

## 2014-11-13 DIAGNOSIS — G629 Polyneuropathy, unspecified: Secondary | ICD-10-CM | POA: Diagnosis not present

## 2014-11-13 DIAGNOSIS — N390 Urinary tract infection, site not specified: Secondary | ICD-10-CM | POA: Diagnosis not present

## 2014-11-13 DIAGNOSIS — R4182 Altered mental status, unspecified: Secondary | ICD-10-CM | POA: Diagnosis not present

## 2014-11-13 DIAGNOSIS — E119 Type 2 diabetes mellitus without complications: Secondary | ICD-10-CM | POA: Diagnosis present

## 2014-11-13 DIAGNOSIS — G8929 Other chronic pain: Secondary | ICD-10-CM | POA: Diagnosis present

## 2014-11-13 DIAGNOSIS — Z8673 Personal history of transient ischemic attack (TIA), and cerebral infarction without residual deficits: Secondary | ICD-10-CM | POA: Diagnosis not present

## 2014-11-13 DIAGNOSIS — Z7951 Long term (current) use of inhaled steroids: Secondary | ICD-10-CM | POA: Diagnosis not present

## 2014-11-13 DIAGNOSIS — N178 Other acute kidney failure: Secondary | ICD-10-CM | POA: Diagnosis not present

## 2014-11-13 DIAGNOSIS — Z5189 Encounter for other specified aftercare: Secondary | ICD-10-CM | POA: Diagnosis not present

## 2014-11-13 DIAGNOSIS — R630 Anorexia: Secondary | ICD-10-CM | POA: Diagnosis not present

## 2014-11-13 DIAGNOSIS — N179 Acute kidney failure, unspecified: Secondary | ICD-10-CM | POA: Diagnosis present

## 2014-11-13 DIAGNOSIS — Z79899 Other long term (current) drug therapy: Secondary | ICD-10-CM | POA: Diagnosis not present

## 2014-11-16 DIAGNOSIS — M545 Low back pain: Secondary | ICD-10-CM | POA: Diagnosis not present

## 2014-11-16 DIAGNOSIS — R2689 Other abnormalities of gait and mobility: Secondary | ICD-10-CM | POA: Diagnosis not present

## 2014-11-16 DIAGNOSIS — F419 Anxiety disorder, unspecified: Secondary | ICD-10-CM | POA: Diagnosis not present

## 2014-11-16 DIAGNOSIS — E875 Hyperkalemia: Secondary | ICD-10-CM | POA: Diagnosis not present

## 2014-11-16 DIAGNOSIS — M6281 Muscle weakness (generalized): Secondary | ICD-10-CM | POA: Diagnosis not present

## 2014-11-16 DIAGNOSIS — K449 Diaphragmatic hernia without obstruction or gangrene: Secondary | ICD-10-CM | POA: Diagnosis not present

## 2014-11-16 DIAGNOSIS — N390 Urinary tract infection, site not specified: Secondary | ICD-10-CM | POA: Diagnosis not present

## 2014-11-16 DIAGNOSIS — G43909 Migraine, unspecified, not intractable, without status migrainosus: Secondary | ICD-10-CM | POA: Diagnosis not present

## 2014-11-16 DIAGNOSIS — G8929 Other chronic pain: Secondary | ICD-10-CM | POA: Diagnosis not present

## 2014-11-16 DIAGNOSIS — Z8673 Personal history of transient ischemic attack (TIA), and cerebral infarction without residual deficits: Secondary | ICD-10-CM | POA: Diagnosis not present

## 2014-11-16 DIAGNOSIS — J449 Chronic obstructive pulmonary disease, unspecified: Secondary | ICD-10-CM | POA: Diagnosis not present

## 2014-11-16 DIAGNOSIS — Z5189 Encounter for other specified aftercare: Secondary | ICD-10-CM | POA: Diagnosis not present

## 2014-11-16 DIAGNOSIS — A419 Sepsis, unspecified organism: Secondary | ICD-10-CM | POA: Diagnosis not present

## 2014-11-16 DIAGNOSIS — G629 Polyneuropathy, unspecified: Secondary | ICD-10-CM | POA: Diagnosis not present

## 2014-11-16 DIAGNOSIS — N179 Acute kidney failure, unspecified: Secondary | ICD-10-CM | POA: Diagnosis not present

## 2014-11-16 DIAGNOSIS — N178 Other acute kidney failure: Secondary | ICD-10-CM | POA: Diagnosis not present

## 2014-11-16 DIAGNOSIS — E860 Dehydration: Secondary | ICD-10-CM | POA: Diagnosis not present

## 2014-11-16 DIAGNOSIS — I10 Essential (primary) hypertension: Secondary | ICD-10-CM | POA: Diagnosis not present

## 2014-11-16 DIAGNOSIS — E785 Hyperlipidemia, unspecified: Secondary | ICD-10-CM | POA: Diagnosis not present

## 2014-11-23 DIAGNOSIS — N178 Other acute kidney failure: Secondary | ICD-10-CM | POA: Diagnosis not present

## 2014-12-07 DIAGNOSIS — Z23 Encounter for immunization: Secondary | ICD-10-CM | POA: Diagnosis not present

## 2015-01-21 DIAGNOSIS — M545 Low back pain: Secondary | ICD-10-CM | POA: Diagnosis not present

## 2015-01-21 DIAGNOSIS — N182 Chronic kidney disease, stage 2 (mild): Secondary | ICD-10-CM | POA: Diagnosis not present

## 2015-01-21 DIAGNOSIS — E1165 Type 2 diabetes mellitus with hyperglycemia: Secondary | ICD-10-CM | POA: Diagnosis not present

## 2015-04-02 DIAGNOSIS — S0180XA Unspecified open wound of other part of head, initial encounter: Secondary | ICD-10-CM | POA: Diagnosis not present

## 2015-04-02 DIAGNOSIS — S0181XA Laceration without foreign body of other part of head, initial encounter: Secondary | ICD-10-CM | POA: Diagnosis not present

## 2015-04-02 DIAGNOSIS — S199XXA Unspecified injury of neck, initial encounter: Secondary | ICD-10-CM | POA: Diagnosis not present

## 2015-04-02 DIAGNOSIS — Z7982 Long term (current) use of aspirin: Secondary | ICD-10-CM | POA: Diagnosis not present

## 2015-04-02 DIAGNOSIS — J449 Chronic obstructive pulmonary disease, unspecified: Secondary | ICD-10-CM | POA: Diagnosis not present

## 2015-04-02 DIAGNOSIS — S60512A Abrasion of left hand, initial encounter: Secondary | ICD-10-CM | POA: Diagnosis not present

## 2015-04-02 DIAGNOSIS — S299XXA Unspecified injury of thorax, initial encounter: Secondary | ICD-10-CM | POA: Diagnosis not present

## 2015-04-02 DIAGNOSIS — E119 Type 2 diabetes mellitus without complications: Secondary | ICD-10-CM | POA: Diagnosis not present

## 2015-04-02 DIAGNOSIS — S0990XA Unspecified injury of head, initial encounter: Secondary | ICD-10-CM | POA: Diagnosis not present

## 2015-04-02 DIAGNOSIS — M542 Cervicalgia: Secondary | ICD-10-CM | POA: Diagnosis not present

## 2015-04-02 DIAGNOSIS — W01198A Fall on same level from slipping, tripping and stumbling with subsequent striking against other object, initial encounter: Secondary | ICD-10-CM | POA: Diagnosis not present

## 2015-04-02 DIAGNOSIS — T148 Other injury of unspecified body region: Secondary | ICD-10-CM | POA: Diagnosis not present

## 2015-04-02 DIAGNOSIS — S0033XA Contusion of nose, initial encounter: Secondary | ICD-10-CM | POA: Diagnosis not present

## 2015-04-02 DIAGNOSIS — R51 Headache: Secondary | ICD-10-CM | POA: Diagnosis not present

## 2015-04-02 DIAGNOSIS — S0993XA Unspecified injury of face, initial encounter: Secondary | ICD-10-CM | POA: Diagnosis not present

## 2015-04-02 DIAGNOSIS — S0083XA Contusion of other part of head, initial encounter: Secondary | ICD-10-CM | POA: Diagnosis not present

## 2015-04-02 DIAGNOSIS — M25551 Pain in right hip: Secondary | ICD-10-CM | POA: Diagnosis not present

## 2015-04-02 DIAGNOSIS — F17200 Nicotine dependence, unspecified, uncomplicated: Secondary | ICD-10-CM | POA: Diagnosis not present

## 2015-04-02 DIAGNOSIS — M25552 Pain in left hip: Secondary | ICD-10-CM | POA: Diagnosis not present

## 2015-04-02 DIAGNOSIS — Z79899 Other long term (current) drug therapy: Secondary | ICD-10-CM | POA: Diagnosis not present

## 2015-04-02 DIAGNOSIS — I10 Essential (primary) hypertension: Secondary | ICD-10-CM | POA: Diagnosis not present

## 2015-04-02 DIAGNOSIS — S064X0A Epidural hemorrhage without loss of consciousness, initial encounter: Secondary | ICD-10-CM | POA: Diagnosis not present

## 2015-04-04 DIAGNOSIS — M545 Low back pain: Secondary | ICD-10-CM | POA: Diagnosis not present

## 2015-04-04 DIAGNOSIS — N182 Chronic kidney disease, stage 2 (mild): Secondary | ICD-10-CM | POA: Diagnosis not present

## 2015-06-03 DIAGNOSIS — N182 Chronic kidney disease, stage 2 (mild): Secondary | ICD-10-CM | POA: Diagnosis not present

## 2015-06-03 DIAGNOSIS — Z0000 Encounter for general adult medical examination without abnormal findings: Secondary | ICD-10-CM | POA: Diagnosis not present

## 2015-06-03 DIAGNOSIS — Z1389 Encounter for screening for other disorder: Secondary | ICD-10-CM | POA: Diagnosis not present

## 2015-06-03 DIAGNOSIS — Z131 Encounter for screening for diabetes mellitus: Secondary | ICD-10-CM | POA: Diagnosis not present

## 2015-06-03 DIAGNOSIS — M545 Low back pain: Secondary | ICD-10-CM | POA: Diagnosis not present

## 2015-06-03 DIAGNOSIS — I10 Essential (primary) hypertension: Secondary | ICD-10-CM | POA: Diagnosis not present

## 2015-06-04 DIAGNOSIS — E11319 Type 2 diabetes mellitus with unspecified diabetic retinopathy without macular edema: Secondary | ICD-10-CM | POA: Diagnosis not present

## 2015-06-11 DIAGNOSIS — K7689 Other specified diseases of liver: Secondary | ICD-10-CM | POA: Diagnosis not present

## 2015-06-11 DIAGNOSIS — N281 Cyst of kidney, acquired: Secondary | ICD-10-CM | POA: Diagnosis not present

## 2015-06-11 DIAGNOSIS — R109 Unspecified abdominal pain: Secondary | ICD-10-CM | POA: Diagnosis not present

## 2015-06-11 DIAGNOSIS — R1084 Generalized abdominal pain: Secondary | ICD-10-CM | POA: Diagnosis not present

## 2015-07-18 DIAGNOSIS — H538 Other visual disturbances: Secondary | ICD-10-CM | POA: Diagnosis not present

## 2015-07-18 DIAGNOSIS — H2513 Age-related nuclear cataract, bilateral: Secondary | ICD-10-CM | POA: Diagnosis not present

## 2015-08-02 DIAGNOSIS — M545 Low back pain: Secondary | ICD-10-CM | POA: Diagnosis not present

## 2015-08-02 DIAGNOSIS — I10 Essential (primary) hypertension: Secondary | ICD-10-CM | POA: Diagnosis not present

## 2015-08-02 DIAGNOSIS — N182 Chronic kidney disease, stage 2 (mild): Secondary | ICD-10-CM | POA: Diagnosis not present

## 2015-08-02 DIAGNOSIS — E1165 Type 2 diabetes mellitus with hyperglycemia: Secondary | ICD-10-CM | POA: Diagnosis not present

## 2015-08-15 DIAGNOSIS — N182 Chronic kidney disease, stage 2 (mild): Secondary | ICD-10-CM | POA: Diagnosis not present

## 2015-08-15 DIAGNOSIS — E1165 Type 2 diabetes mellitus with hyperglycemia: Secondary | ICD-10-CM | POA: Diagnosis not present

## 2015-08-15 DIAGNOSIS — I10 Essential (primary) hypertension: Secondary | ICD-10-CM | POA: Diagnosis not present

## 2015-08-15 DIAGNOSIS — E784 Other hyperlipidemia: Secondary | ICD-10-CM | POA: Diagnosis not present

## 2015-09-16 DIAGNOSIS — E1165 Type 2 diabetes mellitus with hyperglycemia: Secondary | ICD-10-CM | POA: Diagnosis not present

## 2015-09-16 DIAGNOSIS — E784 Other hyperlipidemia: Secondary | ICD-10-CM | POA: Diagnosis not present

## 2015-09-16 DIAGNOSIS — N182 Chronic kidney disease, stage 2 (mild): Secondary | ICD-10-CM | POA: Diagnosis not present

## 2015-09-16 DIAGNOSIS — I10 Essential (primary) hypertension: Secondary | ICD-10-CM | POA: Diagnosis not present

## 2015-09-17 DIAGNOSIS — Z23 Encounter for immunization: Secondary | ICD-10-CM | POA: Diagnosis not present

## 2015-10-02 DIAGNOSIS — I10 Essential (primary) hypertension: Secondary | ICD-10-CM | POA: Diagnosis not present

## 2015-10-02 DIAGNOSIS — N182 Chronic kidney disease, stage 2 (mild): Secondary | ICD-10-CM | POA: Diagnosis not present

## 2015-10-02 DIAGNOSIS — E784 Other hyperlipidemia: Secondary | ICD-10-CM | POA: Diagnosis not present

## 2015-10-02 DIAGNOSIS — E1165 Type 2 diabetes mellitus with hyperglycemia: Secondary | ICD-10-CM | POA: Diagnosis not present

## 2015-10-03 DIAGNOSIS — N182 Chronic kidney disease, stage 2 (mild): Secondary | ICD-10-CM | POA: Diagnosis not present

## 2015-10-03 DIAGNOSIS — E1122 Type 2 diabetes mellitus with diabetic chronic kidney disease: Secondary | ICD-10-CM | POA: Diagnosis not present

## 2015-10-03 DIAGNOSIS — I10 Essential (primary) hypertension: Secondary | ICD-10-CM | POA: Diagnosis not present

## 2015-10-03 DIAGNOSIS — M545 Low back pain: Secondary | ICD-10-CM | POA: Diagnosis not present

## 2015-10-04 DIAGNOSIS — N182 Chronic kidney disease, stage 2 (mild): Secondary | ICD-10-CM | POA: Diagnosis not present

## 2015-10-04 DIAGNOSIS — I10 Essential (primary) hypertension: Secondary | ICD-10-CM | POA: Diagnosis not present

## 2015-10-04 DIAGNOSIS — M545 Low back pain: Secondary | ICD-10-CM | POA: Diagnosis not present

## 2015-10-04 DIAGNOSIS — E1122 Type 2 diabetes mellitus with diabetic chronic kidney disease: Secondary | ICD-10-CM | POA: Diagnosis not present

## 2015-10-09 DIAGNOSIS — I2510 Atherosclerotic heart disease of native coronary artery without angina pectoris: Secondary | ICD-10-CM | POA: Diagnosis not present

## 2015-10-09 DIAGNOSIS — D3502 Benign neoplasm of left adrenal gland: Secondary | ICD-10-CM | POA: Diagnosis not present

## 2015-10-09 DIAGNOSIS — J984 Other disorders of lung: Secondary | ICD-10-CM | POA: Diagnosis not present

## 2015-10-09 DIAGNOSIS — R918 Other nonspecific abnormal finding of lung field: Secondary | ICD-10-CM | POA: Diagnosis not present

## 2015-11-07 DIAGNOSIS — N182 Chronic kidney disease, stage 2 (mild): Secondary | ICD-10-CM | POA: Diagnosis not present

## 2015-11-07 DIAGNOSIS — E784 Other hyperlipidemia: Secondary | ICD-10-CM | POA: Diagnosis not present

## 2015-11-07 DIAGNOSIS — E1165 Type 2 diabetes mellitus with hyperglycemia: Secondary | ICD-10-CM | POA: Diagnosis not present

## 2015-11-07 DIAGNOSIS — I10 Essential (primary) hypertension: Secondary | ICD-10-CM | POA: Diagnosis not present

## 2015-11-07 DIAGNOSIS — M545 Low back pain: Secondary | ICD-10-CM | POA: Diagnosis not present

## 2015-12-05 DIAGNOSIS — M545 Low back pain: Secondary | ICD-10-CM | POA: Diagnosis not present

## 2015-12-05 DIAGNOSIS — I10 Essential (primary) hypertension: Secondary | ICD-10-CM | POA: Diagnosis not present

## 2015-12-05 DIAGNOSIS — E1122 Type 2 diabetes mellitus with diabetic chronic kidney disease: Secondary | ICD-10-CM | POA: Diagnosis not present

## 2015-12-05 DIAGNOSIS — N182 Chronic kidney disease, stage 2 (mild): Secondary | ICD-10-CM | POA: Diagnosis not present

## 2015-12-23 DIAGNOSIS — M545 Low back pain: Secondary | ICD-10-CM | POA: Diagnosis not present

## 2015-12-23 DIAGNOSIS — N182 Chronic kidney disease, stage 2 (mild): Secondary | ICD-10-CM | POA: Diagnosis not present

## 2015-12-23 DIAGNOSIS — E784 Other hyperlipidemia: Secondary | ICD-10-CM | POA: Diagnosis not present

## 2015-12-23 DIAGNOSIS — E1165 Type 2 diabetes mellitus with hyperglycemia: Secondary | ICD-10-CM | POA: Diagnosis not present

## 2015-12-23 DIAGNOSIS — I10 Essential (primary) hypertension: Secondary | ICD-10-CM | POA: Diagnosis not present

## 2016-01-02 DIAGNOSIS — E784 Other hyperlipidemia: Secondary | ICD-10-CM | POA: Diagnosis not present

## 2016-01-02 DIAGNOSIS — E1165 Type 2 diabetes mellitus with hyperglycemia: Secondary | ICD-10-CM | POA: Diagnosis not present

## 2016-01-02 DIAGNOSIS — M545 Low back pain: Secondary | ICD-10-CM | POA: Diagnosis not present

## 2016-01-02 DIAGNOSIS — N182 Chronic kidney disease, stage 2 (mild): Secondary | ICD-10-CM | POA: Diagnosis not present

## 2016-01-02 DIAGNOSIS — I10 Essential (primary) hypertension: Secondary | ICD-10-CM | POA: Diagnosis not present

## 2016-02-06 DIAGNOSIS — I10 Essential (primary) hypertension: Secondary | ICD-10-CM | POA: Diagnosis not present

## 2016-02-06 DIAGNOSIS — E1122 Type 2 diabetes mellitus with diabetic chronic kidney disease: Secondary | ICD-10-CM | POA: Diagnosis not present

## 2016-02-06 DIAGNOSIS — H671 Otitis media in diseases classified elsewhere, right ear: Secondary | ICD-10-CM | POA: Diagnosis not present

## 2016-02-06 DIAGNOSIS — N182 Chronic kidney disease, stage 2 (mild): Secondary | ICD-10-CM | POA: Diagnosis not present

## 2016-02-06 DIAGNOSIS — M545 Low back pain: Secondary | ICD-10-CM | POA: Diagnosis not present

## 2016-02-07 DIAGNOSIS — E784 Other hyperlipidemia: Secondary | ICD-10-CM | POA: Diagnosis not present

## 2016-02-07 DIAGNOSIS — M545 Low back pain: Secondary | ICD-10-CM | POA: Diagnosis not present

## 2016-02-07 DIAGNOSIS — N182 Chronic kidney disease, stage 2 (mild): Secondary | ICD-10-CM | POA: Diagnosis not present

## 2016-02-07 DIAGNOSIS — I10 Essential (primary) hypertension: Secondary | ICD-10-CM | POA: Diagnosis not present

## 2016-02-07 DIAGNOSIS — E1165 Type 2 diabetes mellitus with hyperglycemia: Secondary | ICD-10-CM | POA: Diagnosis not present

## 2016-03-05 DIAGNOSIS — E784 Other hyperlipidemia: Secondary | ICD-10-CM | POA: Diagnosis not present

## 2016-03-05 DIAGNOSIS — E1165 Type 2 diabetes mellitus with hyperglycemia: Secondary | ICD-10-CM | POA: Diagnosis not present

## 2016-03-05 DIAGNOSIS — M545 Low back pain: Secondary | ICD-10-CM | POA: Diagnosis not present

## 2016-03-05 DIAGNOSIS — I10 Essential (primary) hypertension: Secondary | ICD-10-CM | POA: Diagnosis not present

## 2016-03-05 DIAGNOSIS — N182 Chronic kidney disease, stage 2 (mild): Secondary | ICD-10-CM | POA: Diagnosis not present

## 2016-04-09 DIAGNOSIS — I10 Essential (primary) hypertension: Secondary | ICD-10-CM | POA: Diagnosis not present

## 2016-04-09 DIAGNOSIS — E1122 Type 2 diabetes mellitus with diabetic chronic kidney disease: Secondary | ICD-10-CM | POA: Diagnosis not present

## 2016-04-09 DIAGNOSIS — M545 Low back pain: Secondary | ICD-10-CM | POA: Diagnosis not present

## 2016-04-09 DIAGNOSIS — N182 Chronic kidney disease, stage 2 (mild): Secondary | ICD-10-CM | POA: Diagnosis not present

## 2016-06-08 DIAGNOSIS — M545 Low back pain: Secondary | ICD-10-CM | POA: Diagnosis not present

## 2016-06-08 DIAGNOSIS — N182 Chronic kidney disease, stage 2 (mild): Secondary | ICD-10-CM | POA: Diagnosis not present

## 2016-06-08 DIAGNOSIS — Z125 Encounter for screening for malignant neoplasm of prostate: Secondary | ICD-10-CM | POA: Diagnosis not present

## 2016-06-08 DIAGNOSIS — Z1389 Encounter for screening for other disorder: Secondary | ICD-10-CM | POA: Diagnosis not present

## 2016-06-08 DIAGNOSIS — I10 Essential (primary) hypertension: Secondary | ICD-10-CM | POA: Diagnosis not present

## 2016-06-08 DIAGNOSIS — E1122 Type 2 diabetes mellitus with diabetic chronic kidney disease: Secondary | ICD-10-CM | POA: Diagnosis not present

## 2016-06-08 DIAGNOSIS — Z0000 Encounter for general adult medical examination without abnormal findings: Secondary | ICD-10-CM | POA: Diagnosis not present

## 2016-06-18 DIAGNOSIS — E1165 Type 2 diabetes mellitus with hyperglycemia: Secondary | ICD-10-CM | POA: Diagnosis not present

## 2016-06-18 DIAGNOSIS — I10 Essential (primary) hypertension: Secondary | ICD-10-CM | POA: Diagnosis not present

## 2016-06-18 DIAGNOSIS — N182 Chronic kidney disease, stage 2 (mild): Secondary | ICD-10-CM | POA: Diagnosis not present

## 2016-06-18 DIAGNOSIS — E784 Other hyperlipidemia: Secondary | ICD-10-CM | POA: Diagnosis not present

## 2016-07-17 DIAGNOSIS — N182 Chronic kidney disease, stage 2 (mild): Secondary | ICD-10-CM | POA: Diagnosis not present

## 2016-07-17 DIAGNOSIS — E784 Other hyperlipidemia: Secondary | ICD-10-CM | POA: Diagnosis not present

## 2016-07-17 DIAGNOSIS — I10 Essential (primary) hypertension: Secondary | ICD-10-CM | POA: Diagnosis not present

## 2016-07-17 DIAGNOSIS — E1165 Type 2 diabetes mellitus with hyperglycemia: Secondary | ICD-10-CM | POA: Diagnosis not present

## 2016-08-04 DIAGNOSIS — I10 Essential (primary) hypertension: Secondary | ICD-10-CM | POA: Diagnosis not present

## 2016-08-04 DIAGNOSIS — N182 Chronic kidney disease, stage 2 (mild): Secondary | ICD-10-CM | POA: Diagnosis not present

## 2016-08-04 DIAGNOSIS — E784 Other hyperlipidemia: Secondary | ICD-10-CM | POA: Diagnosis not present

## 2016-08-04 DIAGNOSIS — E1165 Type 2 diabetes mellitus with hyperglycemia: Secondary | ICD-10-CM | POA: Diagnosis not present

## 2016-08-07 DIAGNOSIS — I10 Essential (primary) hypertension: Secondary | ICD-10-CM | POA: Diagnosis not present

## 2016-08-07 DIAGNOSIS — N182 Chronic kidney disease, stage 2 (mild): Secondary | ICD-10-CM | POA: Diagnosis not present

## 2016-08-07 DIAGNOSIS — Z0000 Encounter for general adult medical examination without abnormal findings: Secondary | ICD-10-CM | POA: Diagnosis not present

## 2016-08-07 DIAGNOSIS — M545 Low back pain: Secondary | ICD-10-CM | POA: Diagnosis not present

## 2016-08-07 DIAGNOSIS — E1122 Type 2 diabetes mellitus with diabetic chronic kidney disease: Secondary | ICD-10-CM | POA: Diagnosis not present

## 2016-09-25 DIAGNOSIS — E784 Other hyperlipidemia: Secondary | ICD-10-CM | POA: Diagnosis not present

## 2016-09-25 DIAGNOSIS — N182 Chronic kidney disease, stage 2 (mild): Secondary | ICD-10-CM | POA: Diagnosis not present

## 2016-09-25 DIAGNOSIS — E1165 Type 2 diabetes mellitus with hyperglycemia: Secondary | ICD-10-CM | POA: Diagnosis not present

## 2016-09-25 DIAGNOSIS — I10 Essential (primary) hypertension: Secondary | ICD-10-CM | POA: Diagnosis not present

## 2016-10-22 DIAGNOSIS — E1122 Type 2 diabetes mellitus with diabetic chronic kidney disease: Secondary | ICD-10-CM | POA: Diagnosis not present

## 2016-10-22 DIAGNOSIS — M545 Low back pain: Secondary | ICD-10-CM | POA: Diagnosis not present

## 2016-10-22 DIAGNOSIS — I10 Essential (primary) hypertension: Secondary | ICD-10-CM | POA: Diagnosis not present

## 2016-10-22 DIAGNOSIS — N182 Chronic kidney disease, stage 2 (mild): Secondary | ICD-10-CM | POA: Diagnosis not present

## 2016-10-22 DIAGNOSIS — Z6824 Body mass index (BMI) 24.0-24.9, adult: Secondary | ICD-10-CM | POA: Diagnosis not present

## 2016-12-15 DIAGNOSIS — E119 Type 2 diabetes mellitus without complications: Secondary | ICD-10-CM | POA: Diagnosis not present

## 2016-12-15 DIAGNOSIS — M810 Age-related osteoporosis without current pathological fracture: Secondary | ICD-10-CM | POA: Diagnosis not present

## 2016-12-22 DIAGNOSIS — I10 Essential (primary) hypertension: Secondary | ICD-10-CM | POA: Diagnosis not present

## 2016-12-22 DIAGNOSIS — E1122 Type 2 diabetes mellitus with diabetic chronic kidney disease: Secondary | ICD-10-CM | POA: Diagnosis not present

## 2016-12-22 DIAGNOSIS — N182 Chronic kidney disease, stage 2 (mild): Secondary | ICD-10-CM | POA: Diagnosis not present

## 2016-12-22 DIAGNOSIS — M545 Low back pain: Secondary | ICD-10-CM | POA: Diagnosis not present

## 2016-12-22 DIAGNOSIS — Z6824 Body mass index (BMI) 24.0-24.9, adult: Secondary | ICD-10-CM | POA: Diagnosis not present

## 2016-12-30 DIAGNOSIS — N182 Chronic kidney disease, stage 2 (mild): Secondary | ICD-10-CM | POA: Diagnosis not present

## 2016-12-30 DIAGNOSIS — I10 Essential (primary) hypertension: Secondary | ICD-10-CM | POA: Diagnosis not present

## 2016-12-30 DIAGNOSIS — M545 Low back pain: Secondary | ICD-10-CM | POA: Diagnosis not present

## 2016-12-30 DIAGNOSIS — E1122 Type 2 diabetes mellitus with diabetic chronic kidney disease: Secondary | ICD-10-CM | POA: Diagnosis not present

## 2017-01-26 DIAGNOSIS — E1122 Type 2 diabetes mellitus with diabetic chronic kidney disease: Secondary | ICD-10-CM | POA: Diagnosis not present

## 2017-01-26 DIAGNOSIS — M545 Low back pain: Secondary | ICD-10-CM | POA: Diagnosis not present

## 2017-01-26 DIAGNOSIS — N182 Chronic kidney disease, stage 2 (mild): Secondary | ICD-10-CM | POA: Diagnosis not present

## 2017-01-26 DIAGNOSIS — I10 Essential (primary) hypertension: Secondary | ICD-10-CM | POA: Diagnosis not present

## 2017-02-03 DIAGNOSIS — N182 Chronic kidney disease, stage 2 (mild): Secondary | ICD-10-CM | POA: Diagnosis not present

## 2017-02-03 DIAGNOSIS — M545 Low back pain: Secondary | ICD-10-CM | POA: Diagnosis not present

## 2017-02-03 DIAGNOSIS — E1122 Type 2 diabetes mellitus with diabetic chronic kidney disease: Secondary | ICD-10-CM | POA: Diagnosis not present

## 2017-02-03 DIAGNOSIS — I10 Essential (primary) hypertension: Secondary | ICD-10-CM | POA: Diagnosis not present

## 2017-02-22 DIAGNOSIS — I10 Essential (primary) hypertension: Secondary | ICD-10-CM | POA: Diagnosis not present

## 2017-02-22 DIAGNOSIS — E1122 Type 2 diabetes mellitus with diabetic chronic kidney disease: Secondary | ICD-10-CM | POA: Diagnosis not present

## 2017-02-22 DIAGNOSIS — N182 Chronic kidney disease, stage 2 (mild): Secondary | ICD-10-CM | POA: Diagnosis not present

## 2017-02-22 DIAGNOSIS — M545 Low back pain: Secondary | ICD-10-CM | POA: Diagnosis not present

## 2017-02-22 DIAGNOSIS — Z6824 Body mass index (BMI) 24.0-24.9, adult: Secondary | ICD-10-CM | POA: Diagnosis not present

## 2017-04-20 DIAGNOSIS — N182 Chronic kidney disease, stage 2 (mild): Secondary | ICD-10-CM | POA: Diagnosis not present

## 2017-04-20 DIAGNOSIS — I10 Essential (primary) hypertension: Secondary | ICD-10-CM | POA: Diagnosis not present

## 2017-04-20 DIAGNOSIS — E1122 Type 2 diabetes mellitus with diabetic chronic kidney disease: Secondary | ICD-10-CM | POA: Diagnosis not present

## 2017-04-20 DIAGNOSIS — M545 Low back pain: Secondary | ICD-10-CM | POA: Diagnosis not present

## 2017-04-26 DIAGNOSIS — M545 Low back pain: Secondary | ICD-10-CM | POA: Diagnosis not present

## 2017-04-26 DIAGNOSIS — I10 Essential (primary) hypertension: Secondary | ICD-10-CM | POA: Diagnosis not present

## 2017-04-26 DIAGNOSIS — Z6824 Body mass index (BMI) 24.0-24.9, adult: Secondary | ICD-10-CM | POA: Diagnosis not present

## 2017-04-26 DIAGNOSIS — N182 Chronic kidney disease, stage 2 (mild): Secondary | ICD-10-CM | POA: Diagnosis not present

## 2017-04-26 DIAGNOSIS — E1122 Type 2 diabetes mellitus with diabetic chronic kidney disease: Secondary | ICD-10-CM | POA: Diagnosis not present

## 2017-06-11 DIAGNOSIS — M545 Low back pain: Secondary | ICD-10-CM | POA: Diagnosis not present

## 2017-06-11 DIAGNOSIS — E1122 Type 2 diabetes mellitus with diabetic chronic kidney disease: Secondary | ICD-10-CM | POA: Diagnosis not present

## 2017-06-11 DIAGNOSIS — I10 Essential (primary) hypertension: Secondary | ICD-10-CM | POA: Diagnosis not present

## 2017-06-11 DIAGNOSIS — N182 Chronic kidney disease, stage 2 (mild): Secondary | ICD-10-CM | POA: Diagnosis not present

## 2017-06-21 DIAGNOSIS — E1122 Type 2 diabetes mellitus with diabetic chronic kidney disease: Secondary | ICD-10-CM | POA: Diagnosis not present

## 2017-06-21 DIAGNOSIS — N182 Chronic kidney disease, stage 2 (mild): Secondary | ICD-10-CM | POA: Diagnosis not present

## 2017-06-21 DIAGNOSIS — M545 Low back pain: Secondary | ICD-10-CM | POA: Diagnosis not present

## 2017-06-21 DIAGNOSIS — Z0000 Encounter for general adult medical examination without abnormal findings: Secondary | ICD-10-CM | POA: Diagnosis not present

## 2017-06-21 DIAGNOSIS — Z1389 Encounter for screening for other disorder: Secondary | ICD-10-CM | POA: Diagnosis not present

## 2017-06-21 DIAGNOSIS — I10 Essential (primary) hypertension: Secondary | ICD-10-CM | POA: Diagnosis not present

## 2017-06-21 DIAGNOSIS — Z6824 Body mass index (BMI) 24.0-24.9, adult: Secondary | ICD-10-CM | POA: Diagnosis not present

## 2017-07-19 DIAGNOSIS — I10 Essential (primary) hypertension: Secondary | ICD-10-CM | POA: Diagnosis not present

## 2017-07-19 DIAGNOSIS — N182 Chronic kidney disease, stage 2 (mild): Secondary | ICD-10-CM | POA: Diagnosis not present

## 2017-07-19 DIAGNOSIS — E1122 Type 2 diabetes mellitus with diabetic chronic kidney disease: Secondary | ICD-10-CM | POA: Diagnosis not present

## 2017-07-19 DIAGNOSIS — M545 Low back pain: Secondary | ICD-10-CM | POA: Diagnosis not present

## 2017-08-03 DIAGNOSIS — H524 Presbyopia: Secondary | ICD-10-CM | POA: Diagnosis not present

## 2017-08-13 DIAGNOSIS — M545 Low back pain: Secondary | ICD-10-CM | POA: Diagnosis not present

## 2017-08-13 DIAGNOSIS — N182 Chronic kidney disease, stage 2 (mild): Secondary | ICD-10-CM | POA: Diagnosis not present

## 2017-08-13 DIAGNOSIS — E1122 Type 2 diabetes mellitus with diabetic chronic kidney disease: Secondary | ICD-10-CM | POA: Diagnosis not present

## 2017-08-13 DIAGNOSIS — I10 Essential (primary) hypertension: Secondary | ICD-10-CM | POA: Diagnosis not present

## 2017-08-19 DIAGNOSIS — N182 Chronic kidney disease, stage 2 (mild): Secondary | ICD-10-CM | POA: Diagnosis not present

## 2017-08-19 DIAGNOSIS — Z0000 Encounter for general adult medical examination without abnormal findings: Secondary | ICD-10-CM | POA: Diagnosis not present

## 2017-08-19 DIAGNOSIS — M545 Low back pain: Secondary | ICD-10-CM | POA: Diagnosis not present

## 2017-08-19 DIAGNOSIS — I10 Essential (primary) hypertension: Secondary | ICD-10-CM | POA: Diagnosis not present

## 2017-08-19 DIAGNOSIS — S9031XA Contusion of right foot, initial encounter: Secondary | ICD-10-CM | POA: Diagnosis not present

## 2017-08-19 DIAGNOSIS — E1122 Type 2 diabetes mellitus with diabetic chronic kidney disease: Secondary | ICD-10-CM | POA: Diagnosis not present

## 2017-09-16 DIAGNOSIS — M545 Low back pain: Secondary | ICD-10-CM | POA: Diagnosis not present

## 2017-09-16 DIAGNOSIS — N182 Chronic kidney disease, stage 2 (mild): Secondary | ICD-10-CM | POA: Diagnosis not present

## 2017-09-16 DIAGNOSIS — E1122 Type 2 diabetes mellitus with diabetic chronic kidney disease: Secondary | ICD-10-CM | POA: Diagnosis not present

## 2017-09-16 DIAGNOSIS — I10 Essential (primary) hypertension: Secondary | ICD-10-CM | POA: Diagnosis not present

## 2017-10-07 DIAGNOSIS — H2523 Age-related cataract, morgagnian type, bilateral: Secondary | ICD-10-CM | POA: Diagnosis not present

## 2017-10-12 NOTE — Patient Instructions (Signed)
Your procedure is scheduled on: 10/21/2017  Report to [facility] at  44     AM.  Call this number if you have problems the morning of surgery: [phone]   Do not eat food or drink liquids :After Midnight.      Take these medicines the morning of surgery with A SIP OF WATER: none   Do not wear jewelry, make-up or nail polish.  Do not wear lotions, powders, or perfumes. You may wear deodorant.  Do not shave 48 hours prior to surgery.  Do not bring valuables to the hospital.  Contacts, dentures or bridgework may not be worn into surgery.  Leave suitcase in the car. After surgery it may be brought to your room.  For patients admitted to the hospital, checkout time is 11:00 AM the day of discharge.   Patients discharged the day of surgery will not be allowed to drive home.  :     Please read over the following fact sheets that you were given: Coughing and Deep Breathing, Surgical Site Infection Prevention, Anesthesia Post-op Instructions and Care and Recovery After Surgery    Cataract A cataract is a clouding of the lens of the eye. When a lens becomes cloudy, vision is reduced based on the degree and nature of the clouding. Many cataracts reduce vision to some degree. Some cataracts make people more near-sighted as they develop. Other cataracts increase glare. Cataracts that are ignored and become worse can sometimes look white. The white color can be seen through the pupil. CAUSES   Aging. However, cataracts may occur at any age, even in newborns.   Certain drugs.   Trauma to the eye.   Certain diseases such as diabetes.   Specific eye diseases such as chronic inflammation inside the eye or a sudden attack of a rare form of glaucoma.   Inherited or acquired medical problems.  SYMPTOMS   Gradual, progressive drop in vision in the affected eye.   Severe, rapid visual loss. This most often happens when trauma is the cause.  DIAGNOSIS  To detect a cataract, an eye doctor examines  the lens. Cataracts are best diagnosed with an exam of the eyes with the pupils enlarged (dilated) by drops.  TREATMENT  For an early cataract, vision may improve by using different eyeglasses or stronger lighting. If that does not help your vision, surgery is the only effective treatment. A cataract needs to be surgically removed when vision loss interferes with your everyday activities, such as driving, reading, or watching TV. A cataract may also have to be removed if it prevents examination or treatment of another eye problem. Surgery removes the cloudy lens and usually replaces it with a substitute lens (intraocular lens, IOL).  At a time when both you and your doctor agree, the cataract will be surgically removed. If you have cataracts in both eyes, only one is usually removed at a time. This allows the operated eye to heal and be out of danger from any possible problems after surgery (such as infection or poor wound healing). In rare cases, a cataract may be doing damage to your eye. In these cases, your caregiver may advise surgical removal right away. The vast majority of people who have cataract surgery have better vision afterward. HOME CARE INSTRUCTIONS  If you are not planning surgery, you may be asked to do the following:  Use different eyeglasses.   Use stronger or brighter lighting.   Ask your eye doctor about reducing your  medicine dose or changing medicines if it is thought that a medicine caused your cataract. Changing medicines does not make the cataract go away on its own.   Become familiar with your surroundings. Poor vision can lead to injury. Avoid bumping into things on the affected side. You are at a higher risk for tripping or falling.   Exercise extreme care when driving or operating machinery.   Wear sunglasses if you are sensitive to bright light or experiencing problems with glare.  SEEK IMMEDIATE MEDICAL CARE IF:   You have a worsening or sudden vision loss.    You notice redness, swelling, or increasing pain in the eye.   You have a fever.  Document Released: 02/16/2005 Document Revised: 02/05/2011 Document Reviewed: 10/10/2010 [facility] Patient Information 2012 [place].PATIENT INSTRUCTIONS POST-ANESTHESIA  IMMEDIATELY FOLLOWING SURGERY:  Do not drive or operate machinery for the first twenty four hours after surgery.  Do not make any important decisions for twenty four hours after surgery or while taking narcotic pain medications or sedatives.  If you develop intractable nausea and vomiting or a severe headache please notify your doctor immediately.  FOLLOW-UP:  Please make an appointment with your surgeon as instructed. You do not need to follow up with anesthesia unless specifically instructed to do so.  WOUND CARE INSTRUCTIONS (if applicable):  Keep a dry clean dressing on the anesthesia/puncture wound site if there is drainage.  Once the wound has quit draining you may leave it open to air.  Generally you should leave the bandage intact for twenty four hours unless there is drainage.  If the epidural site drains for more than 36-48 hours please call the anesthesia department.  QUESTIONS?:  Please feel free to call your physician or the hospital operator if you have any questions, and they will be happy to assist you.

## 2017-10-15 ENCOUNTER — Emergency Department (HOSPITAL_COMMUNITY): Payer: Medicare HMO

## 2017-10-15 ENCOUNTER — Other Ambulatory Visit: Payer: Self-pay

## 2017-10-15 ENCOUNTER — Encounter (HOSPITAL_COMMUNITY)
Admission: RE | Admit: 2017-10-15 | Discharge: 2017-10-15 | Disposition: A | Discharge status: Home / Self Care | Payer: Medicare HMO | Source: Ambulatory Visit | Attending: Ophthalmology | Admitting: Ophthalmology

## 2017-10-15 ENCOUNTER — Encounter (HOSPITAL_COMMUNITY): Payer: Self-pay

## 2017-10-15 ENCOUNTER — Observation Stay (HOSPITAL_COMMUNITY)
Admission: EM | Admit: 2017-10-15 | Discharge: 2017-10-18 | Disposition: A | Discharge status: Home / Self Care | Payer: Medicare HMO | Attending: Internal Medicine | Admitting: Internal Medicine

## 2017-10-15 ENCOUNTER — Observation Stay (HOSPITAL_COMMUNITY): Payer: Medicare HMO

## 2017-10-15 DIAGNOSIS — R6510 Systemic inflammatory response syndrome (SIRS) of non-infectious origin without acute organ dysfunction: Secondary | ICD-10-CM

## 2017-10-15 DIAGNOSIS — I2510 Atherosclerotic heart disease of native coronary artery without angina pectoris: Secondary | ICD-10-CM | POA: Diagnosis not present

## 2017-10-15 DIAGNOSIS — Z79899 Other long term (current) drug therapy: Secondary | ICD-10-CM | POA: Diagnosis not present

## 2017-10-15 DIAGNOSIS — J449 Chronic obstructive pulmonary disease, unspecified: Secondary | ICD-10-CM | POA: Diagnosis not present

## 2017-10-15 DIAGNOSIS — Z720 Tobacco use: Secondary | ICD-10-CM | POA: Diagnosis not present

## 2017-10-15 DIAGNOSIS — Z7984 Long term (current) use of oral hypoglycemic drugs: Secondary | ICD-10-CM | POA: Insufficient documentation

## 2017-10-15 DIAGNOSIS — S0990XA Unspecified injury of head, initial encounter: Secondary | ICD-10-CM | POA: Diagnosis not present

## 2017-10-15 DIAGNOSIS — A419 Sepsis, unspecified organism: Principal | ICD-10-CM | POA: Insufficient documentation

## 2017-10-15 DIAGNOSIS — I951 Orthostatic hypotension: Secondary | ICD-10-CM | POA: Diagnosis not present

## 2017-10-15 DIAGNOSIS — E782 Mixed hyperlipidemia: Secondary | ICD-10-CM | POA: Diagnosis not present

## 2017-10-15 DIAGNOSIS — E785 Hyperlipidemia, unspecified: Secondary | ICD-10-CM | POA: Diagnosis not present

## 2017-10-15 DIAGNOSIS — R55 Syncope and collapse: Secondary | ICD-10-CM | POA: Diagnosis present

## 2017-10-15 DIAGNOSIS — R69 Illness, unspecified: Secondary | ICD-10-CM | POA: Diagnosis not present

## 2017-10-15 DIAGNOSIS — N390 Urinary tract infection, site not specified: Secondary | ICD-10-CM | POA: Insufficient documentation

## 2017-10-15 DIAGNOSIS — E1169 Type 2 diabetes mellitus with other specified complication: Secondary | ICD-10-CM | POA: Diagnosis not present

## 2017-10-15 DIAGNOSIS — F17210 Nicotine dependence, cigarettes, uncomplicated: Secondary | ICD-10-CM | POA: Diagnosis not present

## 2017-10-15 DIAGNOSIS — I10 Essential (primary) hypertension: Secondary | ICD-10-CM | POA: Diagnosis not present

## 2017-10-15 DIAGNOSIS — J929 Pleural plaque without asbestos: Secondary | ICD-10-CM | POA: Diagnosis not present

## 2017-10-15 DIAGNOSIS — N179 Acute kidney failure, unspecified: Secondary | ICD-10-CM | POA: Insufficient documentation

## 2017-10-15 HISTORY — DX: Atherosclerotic heart disease of native coronary artery without angina pectoris: I25.10

## 2017-10-15 HISTORY — DX: Calculus of kidney: N20.0

## 2017-10-15 HISTORY — DX: Gastro-esophageal reflux disease without esophagitis: K21.9

## 2017-10-15 HISTORY — DX: Chronic obstructive pulmonary disease, unspecified: J44.9

## 2017-10-15 HISTORY — DX: Type 2 diabetes mellitus without complications: E11.9

## 2017-10-15 HISTORY — DX: Essential (primary) hypertension: I10

## 2017-10-15 LAB — URINALYSIS, ROUTINE W REFLEX MICROSCOPIC
Bilirubin Urine: NEGATIVE
Glucose, UA: NEGATIVE mg/dL
Ketones, ur: NEGATIVE mg/dL
Nitrite: POSITIVE — AB
Protein, ur: NEGATIVE mg/dL
Specific Gravity, Urine: 1.009 (ref 1.005–1.030)
WBC, UA: >50 WBC/hpf — ABNORMAL HIGH (ref 0–5)
pH: 6 (ref 5.0–8.0)

## 2017-10-15 LAB — BASIC METABOLIC PANEL
Anion gap: 13 (ref 5–15)
BUN: 31 mg/dL — ABNORMAL HIGH (ref 8–23)
CO2: 21 mmol/L — ABNORMAL LOW (ref 22–32)
Calcium: 9.7 mg/dL (ref 8.9–10.3)
Chloride: 100 mmol/L (ref 98–111)
Creatinine, Ser: 2.43 mg/dL — ABNORMAL HIGH (ref 0.61–1.24)
GFR calc Af Amer: 29 mL/min — ABNORMAL LOW (ref >60)
GFR calc non Af Amer: 25 mL/min — ABNORMAL LOW (ref >60)
Glucose, Bld: 105 mg/dL — ABNORMAL HIGH (ref 70–99)
Potassium: 4.3 mmol/L (ref 3.5–5.1)
Sodium: 134 mmol/L — ABNORMAL LOW (ref 135–145)

## 2017-10-15 LAB — CBC
HCT: 36.5 % — ABNORMAL LOW (ref 39.0–52.0)
Hemoglobin: 12.6 g/dL — ABNORMAL LOW (ref 13.0–17.0)
MCH: 33.9 pg (ref 26.0–34.0)
MCHC: 34.5 g/dL (ref 30.0–36.0)
MCV: 98.1 fL (ref 78.0–100.0)
Platelets: 236 10*3/uL (ref 150–400)
RBC: 3.72 MIL/uL — ABNORMAL LOW (ref 4.22–5.81)
RDW: 11.8 % (ref 11.5–15.5)
WBC: 15.7 10*3/uL — ABNORMAL HIGH (ref 4.0–10.5)

## 2017-10-15 LAB — CBG MONITORING, ED: Glucose-Capillary: 108 mg/dL — ABNORMAL HIGH (ref 70–99)

## 2017-10-15 LAB — DIFFERENTIAL
Basophils Absolute: 0.1 10*3/uL (ref 0.0–0.1)
Basophils Relative: 0 %
Eosinophils Absolute: 0.4 10*3/uL (ref 0.0–0.7)
Eosinophils Relative: 3 %
Lymphocytes Relative: 29 %
Lymphs Abs: 4.5 10*3/uL — ABNORMAL HIGH (ref 0.7–4.0)
Monocytes Absolute: 1 10*3/uL (ref 0.1–1.0)
Monocytes Relative: 6 %
Neutro Abs: 9.7 10*3/uL — ABNORMAL HIGH (ref 1.7–7.7)
Neutrophils Relative %: 62 %

## 2017-10-15 LAB — HEPATIC FUNCTION PANEL
ALT: 9 U/L (ref 0–44)
AST: 23 U/L (ref 15–41)
Albumin: 4.2 g/dL (ref 3.5–5.0)
Alkaline Phosphatase: 94 U/L (ref 38–126)
Bilirubin, Direct: 0.2 mg/dL (ref 0.0–0.2)
Indirect Bilirubin: 0.6 mg/dL (ref 0.3–0.9)
Total Bilirubin: 0.8 mg/dL (ref 0.3–1.2)
Total Protein: 7.9 g/dL (ref 6.5–8.1)

## 2017-10-15 LAB — TROPONIN I
Troponin I: <0.03 ng/mL (ref <0.03)
Troponin I: <0.03 ng/mL (ref <0.03)
Troponin I: <0.03 ng/mL (ref <0.03)

## 2017-10-15 LAB — PROTIME-INR
INR: 1
Prothrombin Time: 13.1 seconds (ref 11.4–15.2)

## 2017-10-15 LAB — GLUCOSE, CAPILLARY
Glucose-Capillary: 130 mg/dL — ABNORMAL HIGH (ref 70–99)
Glucose-Capillary: 106 mg/dL — ABNORMAL HIGH (ref 70–99)

## 2017-10-15 LAB — APTT: aPTT: 27 seconds (ref 24–36)

## 2017-10-15 LAB — LACTIC ACID, PLASMA
Lactic Acid, Venous: 2.4 mmol/L (ref 0.5–1.9)
Lactic Acid, Venous: 2.4 mmol/L (ref 0.5–1.9)
Lactic Acid, Venous: 2.6 mmol/L (ref 0.5–1.9)

## 2017-10-15 LAB — HEMOGLOBIN A1C
Hgb A1c MFr Bld: 5.5 % (ref 4.8–5.6)
Mean Plasma Glucose: 111.15 mg/dL

## 2017-10-15 LAB — PROCALCITONIN: Procalcitonin: <0.1 ng/mL

## 2017-10-15 MED ORDER — ONDANSETRON HCL 4 MG PO TABS: 4.0000 mg | ORAL_TABLET | Freq: Four times a day (QID) | ORAL | Status: DC | PRN

## 2017-10-15 MED ORDER — ACETAMINOPHEN 325 MG PO TABS
650.0000 mg | ORAL_TABLET | Freq: Four times a day (QID) | ORAL | Status: DC | PRN
Start: 2017-10-15 — End: 2017-10-18
  Administered 2017-10-15 – 2017-10-18 (×7): 650 mg via ORAL
  Filled 2017-10-15 (×8): qty 2

## 2017-10-15 MED ORDER — PANTOPRAZOLE SODIUM 40 MG PO TBEC
40.0000 mg | DELAYED_RELEASE_TABLET | Freq: Every day | ORAL | Status: DC
  Administered 2017-10-15 – 2017-10-18 (×4): 40 mg via ORAL
  Filled 2017-10-15 (×4): qty 1

## 2017-10-15 MED ORDER — SODIUM CHLORIDE 0.9% FLUSH
3.0000 mL | Freq: Two times a day (BID) | INTRAVENOUS | Status: DC
  Administered 2017-10-15 – 2017-10-16 (×2): 3 mL via INTRAVENOUS

## 2017-10-15 MED ORDER — SODIUM CHLORIDE 0.9 % IV SOLN
INTRAVENOUS | Status: DC
  Administered 2017-10-15 – 2017-10-17 (×4): via INTRAVENOUS

## 2017-10-15 MED ORDER — SIMVASTATIN 20 MG PO TABS
20.0000 mg | ORAL_TABLET | Freq: Every day | ORAL | Status: DC
  Administered 2017-10-15 – 2017-10-17 (×3): 20 mg via ORAL
  Filled 2017-10-15 (×3): qty 1

## 2017-10-15 MED ORDER — VANCOMYCIN HCL IN DEXTROSE 1-5 GM/200ML-% IV SOLN: 1000.0000 mg | Freq: Once | INTRAVENOUS | Status: DC

## 2017-10-15 MED ORDER — CEFEPIME HCL 2 G IJ SOLR: 2.0000 g | Freq: Once | INTRAMUSCULAR | Status: DC

## 2017-10-15 MED ORDER — INSULIN ASPART 100 UNIT/ML ~~LOC~~ SOLN
0.0000 [IU] | Freq: Three times a day (TID) | SUBCUTANEOUS | Status: DC
  Administered 2017-10-15 – 2017-10-16 (×2): 1 [IU] via SUBCUTANEOUS

## 2017-10-15 MED ORDER — SODIUM CHLORIDE 0.9 % IV BOLUS
1000.0000 mL | Freq: Once | INTRAVENOUS | Status: AC
  Administered 2017-10-15: 1000 mL via INTRAVENOUS

## 2017-10-15 MED ORDER — ENOXAPARIN SODIUM 40 MG/0.4ML ~~LOC~~ SOLN
40.0000 mg | SUBCUTANEOUS | Status: DC
  Administered 2017-10-15 – 2017-10-17 (×3): 40 mg via SUBCUTANEOUS
  Filled 2017-10-15 (×3): qty 0.4

## 2017-10-15 MED ORDER — EZETIMIBE 10 MG PO TABS
10.0000 mg | ORAL_TABLET | Freq: Every day | ORAL | Status: DC
  Administered 2017-10-15 – 2017-10-17 (×3): 10 mg via ORAL
  Filled 2017-10-15 (×3): qty 1

## 2017-10-15 MED ORDER — ACETAMINOPHEN 650 MG RE SUPP: 650.0000 mg | Freq: Four times a day (QID) | RECTAL | Status: DC | PRN

## 2017-10-15 MED ORDER — METRONIDAZOLE IN NACL 5-0.79 MG/ML-% IV SOLN
500.0000 mg | Freq: Three times a day (TID) | INTRAVENOUS | Status: DC
  Administered 2017-10-15 – 2017-10-16 (×4): 500 mg via INTRAVENOUS
  Filled 2017-10-15 (×4): qty 100

## 2017-10-15 MED ORDER — SODIUM CHLORIDE 0.9 % IV SOLN
2000.0000 mg | Freq: Once | INTRAVENOUS | Status: AC
  Administered 2017-10-15: 2000 mg via INTRAVENOUS
  Filled 2017-10-15: qty 2000

## 2017-10-15 MED ORDER — NICOTINE 21 MG/24HR TD PT24
21.0000 mg | MEDICATED_PATCH | Freq: Every day | TRANSDERMAL | Status: DC
  Administered 2017-10-15 – 2017-10-18 (×4): 21 mg via TRANSDERMAL
  Filled 2017-10-15 (×4): qty 1

## 2017-10-15 MED ORDER — SODIUM CHLORIDE 0.9 % IV SOLN: INTRAVENOUS | Status: DC

## 2017-10-15 MED ORDER — IPRATROPIUM-ALBUTEROL 0.5-2.5 (3) MG/3ML IN SOLN
3.0000 mL | Freq: Four times a day (QID) | RESPIRATORY_TRACT | Status: DC
  Administered 2017-10-15 – 2017-10-18 (×10): 3 mL via RESPIRATORY_TRACT
  Filled 2017-10-15 (×10): qty 3

## 2017-10-15 MED ORDER — ONDANSETRON HCL 4 MG/2ML IJ SOLN: 4.0000 mg | Freq: Four times a day (QID) | INTRAMUSCULAR | Status: DC | PRN

## 2017-10-15 MED ORDER — SODIUM CHLORIDE 0.9 % IV SOLN
2.0000 g | Freq: Once | INTRAVENOUS | Status: AC
  Administered 2017-10-15: 2 g via INTRAVENOUS
  Filled 2017-10-15: qty 2

## 2017-10-15 NOTE — ED Triage Notes (Signed)
Pt was brought over from day sx . Pt almost fell and had near syncopal episode while registering at day sx. Needed pre op for cataract. BP 75/48 when checked by staff. Pt reports dizziness when standing up.

## 2017-10-15 NOTE — ED Notes (Signed)
Report to Morgan, RN 
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
## 2017-10-15 NOTE — ED Notes (Signed)
Pt taken to CT.
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
## 2017-10-15 NOTE — ED Notes (Signed)
CRITICAL VALUE ALERT  Critical Value:  Lactic 2.4  Date & Time Notied:  10/15/17 1300  Provider Notified: Dr Thurnell Garbe  Orders Received/Actions taken:

## 2017-10-15 NOTE — ED Notes (Signed)
EDP In with pt

## 2017-10-15 NOTE — Progress Notes (Signed)
CRITICAL VALUE ALERT  Critical Value:  Lactic Acid 2.6  Date & Time Notied:  08/16/019 1853  Provider Notified: Dr. Carles Collet on 10/15/17 at [place]  Orders Received/Actions taken: No new orders given at this time. Will continue to monitor.

## 2017-10-15 NOTE — Progress Notes (Addendum)
Pharmacy Antibiotic Note  Roger Mccarty is a 72 y.o. male admitted on 10/15/2017 with sepsis.  Pharmacy has been consulted for vancomycin and cefepime dosing.  Plan: start cefepime 1g IV q12h Loading dose:  vancomycin 2g IV x1 dose Maintenance dose:  vancomycin 1g IV q24h Goal vancomycin trough range:  15-20  mcg/mL Pharmacy will continue to monitor renal function, vancomycin troughs, cultures and patient progress.   Height: 5\' 11"  (180.3 cm) Weight: 194 lb 14.2 oz (88.4 kg) IBW/kg (Calculated) : 75.3  Temp (24hrs), Avg:97.6 F (36.4 C), Min:97.3 F (36.3 C), Max:97.8 F (36.6 C)  Recent Labs  Lab 10/15/17 1202 10/15/17 1204 10/15/17 1352  WBC 15.7*  --   --   CREATININE 2.43*  --   --   LATICACIDVEN  --  2.4* 2.4*    Estimated Creatinine Clearance: 30.1 mL/min (A) (by C-G formula based on SCr of 2.43 mg/dL (H)).    Allergies  Allergen Reactions  . Morphine And Related     Antimicrobials this admission: 8/16 cefepime >>   8/16 vancomycin >>  8/16 metronidazole>>  Microbiology results: none ordered at this time 8/16 BCx2: in progress 8/16 UCx:  In progress   Thank you for allowing pharmacy to be a part of this patient's care.   Despina Pole, Pharm. D. Clinical Pharmacist 10/15/2017 5:21 PM

## 2017-10-15 NOTE — Progress Notes (Signed)
Patient presented for PAT for cataract surgery on 8/22.  Had a near syncopal episode in the parking lot prior to entering the hospital.  Very ill appearing gentleman.  Jaundiced skin appearance.  BP sitting was 75/48.  Attempted to perform a standing orthostatic BP, which patient could not tolerate after approximately 30 seconds of standing.  Began having tremors, eyes rolling back and wasn't able to stand.  Not able to obtain second pressure.  Wife states that he does pass out periodically at home.  Ekg revealed ST 105.  Patient stabilized once he returned to the chair within a few minutes.  Advised he and his wife that surgery would need to be postponed until he was in a healthier position to proceed and had been evaluated by a cardiologist.  Was taken to the ED via wheelchair and report called to [place], Therapist, sports.

## 2017-10-15 NOTE — ED Notes (Signed)
Attempted in and out cath. Pt appears uncircumcised but pt states he is. Unable to advance cath no more than half inch. edp aware. Pt states last urinated this am.

## 2017-10-16 ENCOUNTER — Observation Stay (HOSPITAL_BASED_OUTPATIENT_CLINIC_OR_DEPARTMENT_OTHER): Payer: Medicare HMO

## 2017-10-16 DIAGNOSIS — A419 Sepsis, unspecified organism: Secondary | ICD-10-CM | POA: Diagnosis not present

## 2017-10-16 DIAGNOSIS — R55 Syncope and collapse: Secondary | ICD-10-CM | POA: Diagnosis not present

## 2017-10-16 DIAGNOSIS — N390 Urinary tract infection, site not specified: Secondary | ICD-10-CM

## 2017-10-16 DIAGNOSIS — R079 Chest pain, unspecified: Secondary | ICD-10-CM | POA: Diagnosis not present

## 2017-10-16 DIAGNOSIS — R6510 Systemic inflammatory response syndrome (SIRS) of non-infectious origin without acute organ dysfunction: Secondary | ICD-10-CM | POA: Diagnosis not present

## 2017-10-16 DIAGNOSIS — I951 Orthostatic hypotension: Secondary | ICD-10-CM | POA: Diagnosis not present

## 2017-10-16 DIAGNOSIS — Z720 Tobacco use: Secondary | ICD-10-CM | POA: Diagnosis not present

## 2017-10-16 DIAGNOSIS — E782 Mixed hyperlipidemia: Secondary | ICD-10-CM | POA: Diagnosis not present

## 2017-10-16 DIAGNOSIS — N179 Acute kidney failure, unspecified: Secondary | ICD-10-CM | POA: Diagnosis not present

## 2017-10-16 LAB — BASIC METABOLIC PANEL
Anion gap: 8 (ref 5–15)
BUN: 22 mg/dL (ref 8–23)
CO2: 22 mmol/L (ref 22–32)
Calcium: 9.1 mg/dL (ref 8.9–10.3)
Chloride: 109 mmol/L (ref 98–111)
Creatinine, Ser: 1.2 mg/dL (ref 0.61–1.24)
GFR calc Af Amer: >60 mL/min (ref >60)
GFR calc non Af Amer: 60 mL/min — ABNORMAL LOW (ref >60)
Glucose, Bld: 100 mg/dL — ABNORMAL HIGH (ref 70–99)
Potassium: 4.3 mmol/L (ref 3.5–5.1)
Sodium: 139 mmol/L (ref 135–145)

## 2017-10-16 LAB — CBC
HCT: 32.9 % — ABNORMAL LOW (ref 39.0–52.0)
Hemoglobin: 10.7 g/dL — ABNORMAL LOW (ref 13.0–17.0)
MCH: 31.9 pg (ref 26.0–34.0)
MCHC: 32.5 g/dL (ref 30.0–36.0)
MCV: 98.2 fL (ref 78.0–100.0)
Platelets: 186 10*3/uL (ref 150–400)
RBC: 3.35 MIL/uL — ABNORMAL LOW (ref 4.22–5.81)
RDW: 12.4 % (ref 11.5–15.5)
WBC: 11.9 10*3/uL — ABNORMAL HIGH (ref 4.0–10.5)

## 2017-10-16 LAB — ECHOCARDIOGRAM COMPLETE
Height: 71 in
Weight: 3118.19 oz

## 2017-10-16 LAB — HIV ANTIBODY (ROUTINE TESTING W REFLEX): HIV Screen 4th Generation wRfx: NONREACTIVE

## 2017-10-16 MED ORDER — VANCOMYCIN HCL IN DEXTROSE 1-5 GM/200ML-% IV SOLN
1000.0000 mg | INTRAVENOUS | Status: DC
  Administered 2017-10-16: 1000 mg via INTRAVENOUS
  Filled 2017-10-16: qty 200

## 2017-10-16 MED ORDER — SODIUM CHLORIDE 0.9 % IV SOLN
1.0000 g | Freq: Two times a day (BID) | INTRAVENOUS | Status: DC
  Administered 2017-10-16 – 2017-10-17 (×4): 1 g via INTRAVENOUS
  Filled 2017-10-16 (×7): qty 1
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
## 2017-10-16 NOTE — Progress Notes (Signed)
*  PRELIMINARY RESULTS* Echocardiogram 2D Echocardiogram has been performed.  Leavy Cella 10/16/2017, 12:41 PM

## 2017-10-17 DIAGNOSIS — E782 Mixed hyperlipidemia: Secondary | ICD-10-CM | POA: Diagnosis not present

## 2017-10-17 DIAGNOSIS — N390 Urinary tract infection, site not specified: Secondary | ICD-10-CM | POA: Diagnosis not present

## 2017-10-17 DIAGNOSIS — Z720 Tobacco use: Secondary | ICD-10-CM | POA: Diagnosis not present

## 2017-10-17 DIAGNOSIS — R55 Syncope and collapse: Secondary | ICD-10-CM | POA: Diagnosis not present

## 2017-10-17 DIAGNOSIS — R6510 Systemic inflammatory response syndrome (SIRS) of non-infectious origin without acute organ dysfunction: Secondary | ICD-10-CM | POA: Diagnosis not present

## 2017-10-17 DIAGNOSIS — N179 Acute kidney failure, unspecified: Secondary | ICD-10-CM | POA: Diagnosis not present

## 2017-10-17 DIAGNOSIS — I951 Orthostatic hypotension: Secondary | ICD-10-CM | POA: Diagnosis not present

## 2017-10-17 DIAGNOSIS — A419 Sepsis, unspecified organism: Secondary | ICD-10-CM | POA: Diagnosis not present

## 2017-10-17 LAB — URINE CULTURE

## 2017-10-17 LAB — CBC
HCT: 29.7 % — ABNORMAL LOW (ref 39.0–52.0)
Hemoglobin: 9.9 g/dL — ABNORMAL LOW (ref 13.0–17.0)
MCH: 32.5 pg (ref 26.0–34.0)
MCHC: 33.3 g/dL (ref 30.0–36.0)
MCV: 97.4 fL (ref 78.0–100.0)
Platelets: 192 10*3/uL (ref 150–400)
RBC: 3.05 MIL/uL — ABNORMAL LOW (ref 4.22–5.81)
RDW: 12.5 % (ref 11.5–15.5)
WBC: 9.8 10*3/uL (ref 4.0–10.5)

## 2017-10-17 LAB — BASIC METABOLIC PANEL
Anion gap: 8 (ref 5–15)
BUN: 14 mg/dL (ref 8–23)
CO2: 22 mmol/L (ref 22–32)
Calcium: 9 mg/dL (ref 8.9–10.3)
Chloride: 110 mmol/L (ref 98–111)
Creatinine, Ser: 0.99 mg/dL (ref 0.61–1.24)
GFR calc Af Amer: >60 mL/min (ref >60)
GFR calc non Af Amer: >60 mL/min (ref >60)
Glucose, Bld: 98 mg/dL (ref 70–99)
Potassium: 3.7 mmol/L (ref 3.5–5.1)
Sodium: 140 mmol/L (ref 135–145)

## 2017-10-17 MED ORDER — DIPHENHYDRAMINE HCL 50 MG/ML IJ SOLN
12.5000 mg | Freq: Once | INTRAMUSCULAR | Status: AC
  Administered 2017-10-17: 12.5 mg via INTRAVENOUS
  Filled 2017-10-17: qty 1

## 2017-10-17 MED ORDER — PROCHLORPERAZINE EDISYLATE 10 MG/2ML IJ SOLN
10.0000 mg | Freq: Once | INTRAMUSCULAR | Status: AC
  Administered 2017-10-17: 10 mg via INTRAVENOUS
  Filled 2017-10-17: qty 2
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
## 2017-10-17 NOTE — H&P (Signed)
History and Physical  Roger Mccarty ZOX:[id number] DOB: 01-29-1946 DOA: 10/15/2017   PCP: Neale Burly, MD   HISTORY AND PHYSICAL WAS DONE AND DICTATED ON THE DAY OF ADMISSION.  IT WAS MISLABELLED AS "PROGRESS NOTE"  DTAT       Past Medical History:  Diagnosis Date  . COPD (chronic obstructive pulmonary disease) ([place])   . Coronary artery disease    Hx of MI  . Diabetes mellitus without complication ([place])   . GERD (gastroesophageal reflux disease)   . Hypertension   . Kidney stone    History reviewed. No pertinent surgical history. Social History:  reports that he has been smoking cigarettes. He has been smoking about 1.00 pack per day. He has never used smokeless tobacco. He reports that he does not drink alcohol or use drugs.   History reviewed. No pertinent family history.   Allergies  Allergen Reactions  . Morphine And Related      Prior to Admission medications   Medication Sig Start Date End Date Taking? Authorizing Provider  amLODipine (NORVASC) 5 MG tablet Take 5 mg by mouth daily. 09/20/17  Yes [provider]  benazepril (LOTENSIN) 20 MG tablet Take 1 tablet by mouth daily. 09/29/17  Yes [provider]  ezetimibe (ZETIA) 10 MG tablet Take 10 mg by mouth daily. 10/06/17  Yes [provider]  hydrochlorothiazide (HYDRODIURIL) 25 MG tablet Take 1 tablet by mouth See admin instructions. Take one tablet daily. Take with Benazepril 09/29/17  Yes [provider]  HYDROcodone-acetaminophen (NORCO) 10-325 MG tablet Take 1 tablet by mouth 3 (three) times daily. 09/18/17  Yes [provider]  metFORMIN (GLUCOPHAGE) 1000 MG tablet Take 1 tablet by mouth 2 (two) times daily. 08/19/17  Yes [provider]  moxifloxacin (VIGAMOX) 0.5 % ophthalmic solution Place 1 drop into both eyes See admin instructions. Instill one drop as directed three times daily. Start two days before surgery and continue as directed. 10/08/17   Yes [provider]  omeprazole (PRILOSEC) 20 MG capsule Take 20 mg by mouth daily. 09/13/17  Yes [provider]  prednisoLONE acetate (PRED FORTE) 1 % ophthalmic suspension Place 1 drop into both eyes every 6 (six) hours. Instill one drop four times daily for one week and taper as directed. Begin after surgery 10/08/17  Yes [provider]  simvastatin (ZOCOR) 20 MG tablet Take 20 mg by mouth daily. 10/08/17  Yes [provider]  TIOTROPIUM BROMIDE MONOHYDRATE IN Inhale 18 mcg into the lungs daily.   Yes [provider]  topiramate (TOPAMAX) 50 MG tablet Take 50 mg by mouth 2 (two) times daily. 09/06/17  Yes [provider]    Review of Systems:  Constitutional:  No weight loss, night sweats, Fevers, chills, fatigue.  Head&Eyes: No headache.  No vision loss.  No eye pain or scotoma ENT:  No Difficulty swallowing,Tooth/dental problems,Sore throat,  No ear ache, post nasal drip,  Cardio-vascular:  No chest pain, Orthopnea, PND, swelling in lower extremities,  dizziness, palpitations  GI:  No  abdominal pain, nausea, vomiting, diarrhea, loss of appetite, hematochezia, melena, heartburn, indigestion, Resp:  No shortness of breath with exertion or at rest. No cough. No coughing up of blood .No wheezing.No chest wall deformity  Skin:  no rash or lesions.  GU:  no dysuria, change in color of urine, no urgency or frequency. No flank pain.  Musculoskeletal:  No joint pain or swelling. No decreased range of  motion. No back pain.  Psych:  No change in mood or affect. No depression or anxiety. Neurologic: No headache, no dysesthesia, no focal weakness, no vision loss. No syncope  Physical Exam: Vitals:   10/17/17 0526 10/17/17 0724 10/17/17 1325 10/17/17 1400  BP: 125/63   133/69  Pulse: 93   89  Resp: 18   18  Temp: 98.4 F (36.9 C)   98.2 F (36.8 C)  TempSrc: Oral   Oral  SpO2: 98% 98% 99% 100%  Weight:      Height:       General:   A&O x 3, NAD, nontoxic, pleasant/cooperative Head/Eye: No conjunctival hemorrhage, no icterus, [place]/AT, No nystagmus ENT:  No icterus,  No thrush, good dentition, no pharyngeal exudate Neck:  No masses, no lymphadenpathy, no bruits CV:  RRR, no rub, no gallop, no S3 Lung:  CTAB, good air movement, no wheeze, no rhonchi Abdomen: soft/NT, +BS, nondistended, no peritoneal signs Ext: No cyanosis, No rashes, No petechiae, No lymphangitis, No edema Neuro: CNII-XII intact, strength 4/5 in bilateral upper and lower extremities, no dysmetria  Labs on Admission:  Basic Metabolic Panel: Recent Labs  Lab 10/15/17 1202 10/16/17 0709 10/17/17 0518  NA 134* 139 140  K 4.3 4.3 3.7  CL 100 109 110  CO2 21* 22 22  GLUCOSE 105* 100* 98  BUN 31* 22 14  CREATININE 2.43* 1.20 0.99  CALCIUM 9.7 9.1 9.0   Liver Function Tests: Recent Labs  Lab 10/15/17 1202  AST 23  ALT 9  ALKPHOS 94  BILITOT 0.8  PROT 7.9  ALBUMIN 4.2   No results for input(s): LIPASE, AMYLASE in the last 168 hours. No results for input(s): AMMONIA in the last 168 hours. CBC: Recent Labs  Lab 10/15/17 1202 10/16/17 0709 10/17/17 0518  WBC 15.7* 11.9* 9.8  NEUTROABS 9.7*  --   --   HGB 12.6* 10.7* 9.9*  HCT 36.5* 32.9* 29.7*  MCV 98.1 98.2 97.4  PLT 236 186 192   Coagulation Profile: Recent Labs  Lab 10/15/17 1734  INR 1.00   Cardiac Enzymes: Recent Labs  Lab 10/15/17 1202 10/15/17 1734 10/15/17 2225  TROPONINI <0.03 <0.03 <0.03   BNP: Invalid input(s): POCBNP CBG: Recent Labs  Lab 10/15/17 1147 10/15/17 1626 10/15/17 2126  GLUCAP 108* 130* 106*   Urine analysis:    Component Value Date/Time   COLORURINE YELLOW 10/15/2017 2000   APPEARANCEUR CLOUDY (A) 10/15/2017 2000   LABSPEC 1.009 10/15/2017 2000   PHURINE 6.0 10/15/2017 2000   GLUCOSEU NEGATIVE 10/15/2017 2000   HGBUR SMALL (A) 10/15/2017 2000   BILIRUBINUR NEGATIVE 10/15/2017 2000   KETONESUR NEGATIVE 10/15/2017 2000   PROTEINUR  NEGATIVE 10/15/2017 2000   NITRITE POSITIVE (A) 10/15/2017 2000   LEUKOCYTESUR LARGE (A) 10/15/2017 2000   Sepsis Labs: @LABRCNTIP (procalcitonin:4,lacticidven:4) ) Recent Results (from the past 240 hour(s))  Culture, blood (x 2)     Status: None (Preliminary result)   Collection Time: 10/15/17  5:33 PM  Result Value Ref Range Status   Specimen Description BLOOD RIGHT HAND  Final   Special Requests   Final    BOTTLES DRAWN AEROBIC ONLY Blood Culture adequate volume   Culture   Final    NO GROWTH 2 DAYS Performed at [facility], [address] [place], [place] [address]    Report Status PENDING  Incomplete  Culture, blood (x 2)     Status: None (Preliminary result)   Collection Time: 10/15/17  5:40 PM  Result  Value Ref Range Status   Specimen Description BLOOD RIGHT ARM  Final   Special Requests   Final    BOTTLES DRAWN AEROBIC AND ANAEROBIC Blood Culture adequate volume   Culture   Final    NO GROWTH 2 DAYS Performed at [facility], [address], [place], [place] [address]    Report Status PENDING  Incomplete  Urine culture     Status: Abnormal   Collection Time: 10/15/17  8:00 PM  Result Value Ref Range Status   Specimen Description   Final    URINE, CLEAN CATCH Performed at [facility], [address] [place], [place] [address]    Special Requests   Final    NONE Performed at [facility], [address], [place], [place] [address]    Culture MULTIPLE SPECIES PRESENT, SUGGEST RECOLLECTION (A)  Final   Report Status 10/17/2017 FINAL  Final     Radiological Exams on Admission: No results found.   Orson Eva, DO  [facility] Pager [phone]  If 7PM-7AM, please contact night-coverage [email or web address] Password [facility] 10/17/2017, 5:24 PM

## 2017-10-18 DIAGNOSIS — Z720 Tobacco use: Secondary | ICD-10-CM | POA: Diagnosis not present

## 2017-10-18 DIAGNOSIS — E782 Mixed hyperlipidemia: Secondary | ICD-10-CM | POA: Diagnosis not present

## 2017-10-18 DIAGNOSIS — N390 Urinary tract infection, site not specified: Secondary | ICD-10-CM | POA: Diagnosis not present

## 2017-10-18 DIAGNOSIS — R55 Syncope and collapse: Secondary | ICD-10-CM | POA: Diagnosis not present

## 2017-10-18 DIAGNOSIS — A419 Sepsis, unspecified organism: Secondary | ICD-10-CM | POA: Diagnosis not present

## 2017-10-18 DIAGNOSIS — R6510 Systemic inflammatory response syndrome (SIRS) of non-infectious origin without acute organ dysfunction: Secondary | ICD-10-CM | POA: Diagnosis not present

## 2017-10-18 DIAGNOSIS — N179 Acute kidney failure, unspecified: Secondary | ICD-10-CM | POA: Diagnosis not present

## 2017-10-18 DIAGNOSIS — I951 Orthostatic hypotension: Secondary | ICD-10-CM | POA: Diagnosis not present

## 2017-10-18 LAB — GLUCOSE, CAPILLARY
Glucose-Capillary: 97 mg/dL (ref 70–99)
Glucose-Capillary: 131 mg/dL — ABNORMAL HIGH (ref 70–99)
Glucose-Capillary: 113 mg/dL — ABNORMAL HIGH (ref 70–99)
Glucose-Capillary: 138 mg/dL — ABNORMAL HIGH (ref 70–99)
Glucose-Capillary: 106 mg/dL — ABNORMAL HIGH (ref 70–99)
Glucose-Capillary: 112 mg/dL — ABNORMAL HIGH (ref 70–99)
Glucose-Capillary: 120 mg/dL — ABNORMAL HIGH (ref 70–99)
Glucose-Capillary: 112 mg/dL — ABNORMAL HIGH (ref 70–99)
Glucose-Capillary: 115 mg/dL — ABNORMAL HIGH (ref 70–99)
Glucose-Capillary: 135 mg/dL — ABNORMAL HIGH (ref 70–99)

## 2017-10-18 LAB — CBC
HCT: 34 % — ABNORMAL LOW (ref 39.0–52.0)
Hemoglobin: 11.5 g/dL — ABNORMAL LOW (ref 13.0–17.0)
MCH: 33.1 pg (ref 26.0–34.0)
MCHC: 33.8 g/dL (ref 30.0–36.0)
MCV: 98 fL (ref 78.0–100.0)
Platelets: 195 10*3/uL (ref 150–400)
RBC: 3.47 MIL/uL — ABNORMAL LOW (ref 4.22–5.81)
RDW: 12.5 % (ref 11.5–15.5)
WBC: 10 10*3/uL (ref 4.0–10.5)

## 2017-10-18 LAB — BASIC METABOLIC PANEL
Anion gap: 12 (ref 5–15)
BUN: 10 mg/dL (ref 8–23)
CO2: 23 mmol/L (ref 22–32)
Calcium: 9.4 mg/dL (ref 8.9–10.3)
Chloride: 105 mmol/L (ref 98–111)
Creatinine, Ser: 0.85 mg/dL (ref 0.61–1.24)
GFR calc Af Amer: >60 mL/min (ref >60)
GFR calc non Af Amer: >60 mL/min (ref >60)
Glucose, Bld: 106 mg/dL — ABNORMAL HIGH (ref 70–99)
Potassium: 4.2 mmol/L (ref 3.5–5.1)
Sodium: 140 mmol/L (ref 135–145)

## 2017-10-18 MED ORDER — CEFDINIR 300 MG PO CAPS: 300.0000 mg | ORAL_CAPSULE | Freq: Two times a day (BID) | ORAL | 0 refills | Status: AC

## 2017-10-18 MED ORDER — IPRATROPIUM-ALBUTEROL 0.5-2.5 (3) MG/3ML IN SOLN
3.0000 mL | Freq: Four times a day (QID) | RESPIRATORY_TRACT | Status: DC
  Filled 2017-10-18: qty 3

## 2017-10-18 NOTE — Progress Notes (Signed)
Removed IV-clean, dry, intact. Roger Mccarty reviewed d/c paperwork with patient. She answered all questions. She wheeled stable patient and belongings to main entrance where he was picked up by his wife.
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
## 2017-10-19 DIAGNOSIS — Z6824 Body mass index (BMI) 24.0-24.9, adult: Secondary | ICD-10-CM | POA: Diagnosis not present

## 2017-10-19 DIAGNOSIS — Z0000 Encounter for general adult medical examination without abnormal findings: Secondary | ICD-10-CM | POA: Diagnosis not present

## 2017-10-19 DIAGNOSIS — M545 Low back pain: Secondary | ICD-10-CM | POA: Diagnosis not present

## 2017-10-19 DIAGNOSIS — I10 Essential (primary) hypertension: Secondary | ICD-10-CM | POA: Diagnosis not present

## 2017-10-19 DIAGNOSIS — Z6825 Body mass index (BMI) 25.0-25.9, adult: Secondary | ICD-10-CM | POA: Diagnosis not present

## 2017-10-19 DIAGNOSIS — E1122 Type 2 diabetes mellitus with diabetic chronic kidney disease: Secondary | ICD-10-CM | POA: Diagnosis not present

## 2017-10-19 DIAGNOSIS — S9031XA Contusion of right foot, initial encounter: Secondary | ICD-10-CM | POA: Diagnosis not present

## 2017-10-19 DIAGNOSIS — N182 Chronic kidney disease, stage 2 (mild): Secondary | ICD-10-CM | POA: Diagnosis not present

## 2017-10-20 LAB — CULTURE, BLOOD (ROUTINE X 2)
Culture: NO GROWTH
Culture: NO GROWTH
Special Requests: ADEQUATE
Special Requests: ADEQUATE

## 2017-11-16 ENCOUNTER — Other Ambulatory Visit (HOSPITAL_COMMUNITY): Payer: Medicare Other

## 2017-11-20 DIAGNOSIS — E119 Type 2 diabetes mellitus without complications: Secondary | ICD-10-CM | POA: Diagnosis not present

## 2017-11-20 DIAGNOSIS — Z7984 Long term (current) use of oral hypoglycemic drugs: Secondary | ICD-10-CM | POA: Diagnosis not present

## 2017-11-20 DIAGNOSIS — R69 Illness, unspecified: Secondary | ICD-10-CM | POA: Diagnosis not present

## 2017-11-20 DIAGNOSIS — K219 Gastro-esophageal reflux disease without esophagitis: Secondary | ICD-10-CM | POA: Diagnosis not present

## 2017-11-20 DIAGNOSIS — E669 Obesity, unspecified: Secondary | ICD-10-CM | POA: Diagnosis not present

## 2017-11-20 DIAGNOSIS — J449 Chronic obstructive pulmonary disease, unspecified: Secondary | ICD-10-CM | POA: Diagnosis not present

## 2017-11-20 DIAGNOSIS — E7800 Pure hypercholesterolemia, unspecified: Secondary | ICD-10-CM | POA: Diagnosis not present

## 2017-11-20 DIAGNOSIS — I10 Essential (primary) hypertension: Secondary | ICD-10-CM | POA: Diagnosis not present

## 2017-11-20 DIAGNOSIS — Z7982 Long term (current) use of aspirin: Secondary | ICD-10-CM | POA: Diagnosis not present

## 2017-11-20 DIAGNOSIS — M79671 Pain in right foot: Secondary | ICD-10-CM | POA: Diagnosis not present

## 2017-11-25 DIAGNOSIS — H25811 Combined forms of age-related cataract, right eye: Secondary | ICD-10-CM | POA: Diagnosis not present

## 2017-11-29 ENCOUNTER — Encounter (HOSPITAL_COMMUNITY)
Admission: RE | Admit: 2017-11-29 | Discharge: 2017-11-29 | Disposition: A | Discharge status: Home / Self Care | Payer: Medicare HMO | Source: Ambulatory Visit | Attending: Ophthalmology | Admitting: Ophthalmology

## 2017-11-29 ENCOUNTER — Inpatient Hospital Stay (HOSPITAL_COMMUNITY): Admission: RE | Admit: 2017-11-29 | Payer: Medicare HMO | Source: Ambulatory Visit

## 2017-11-29 NOTE — Patient Instructions (Signed)
Your procedure is scheduled on:   12/03/2017              Report to [facility] at    9;00 AM.  Call this number if you have problems the morning of surgery: [phone]   Remember:   Do not eat or drink :After Midnight.    Take these medicines the morning of surgery with A SIP OF WATER:            Do not wear jewelry, make-up or nail polish.  Do not wear lotions, powders, or perfumes. You may wear deodorant.  Do not bring valuables to the hospital.  Contacts, dentures or bridgework may not be worn into surgery.  Patients discharged the day of surgery will not be allowed to drive home.  Name and phone number of your driver:    @[email or web address] @ Cataract Surgery  A cataract is a clouding of the lens of the eye. When a lens becomes cloudy, vision is reduced based on the degree and nature of the clouding. Surgery may be needed to improve vision. Surgery removes the cloudy lens and usually replaces it with a substitute lens (intraocular lens, IOL). LET YOUR EYE DOCTOR KNOW ABOUT:  Allergies to food or medicine.   Medicines taken including herbs, eyedrops, over-the-counter medicines, and creams.   Use of steroids (by mouth or creams).   Previous problems with anesthetics or numbing medicine.   History of bleeding problems or blood clots.   Previous surgery.   Other health problems, including diabetes and kidney problems.   Possibility of pregnancy, if this applies.  RISKS AND COMPLICATIONS  Infection.   Inflammation of the eyeball (endophthalmitis) that can spread to both eyes (sympathetic ophthalmia).   Poor wound healing.   If an IOL is inserted, it can later fall out of proper position. This is very uncommon.   Clouding of the part of your eye that holds an IOL in place. This is called an "after-cataract." These are uncommon, but easily treated.  BEFORE THE PROCEDURE  Do not eat or drink anything except small amounts of water for 8 to 12 before your surgery, or as  directed by your caregiver.   Unless you are told otherwise, continue any eyedrops you have been prescribed.   Talk to your primary caregiver about all other medicines that you take (both prescription and non-prescription). In some cases, you may need to stop or change medicines near the time of your surgery. This is most important if you are taking blood-thinning medicine.Do not stop medicines unless you are told to do so.   Arrange for someone to drive you to and from the procedure.   Do not put contact lenses in either eye on the day of your surgery.  PROCEDURE There is more than one method for safely removing a cataract. Your doctor can explain the differences and help determine which is best for you. Phacoemulsification surgery is the most common form of cataract surgery.  An injection is given behind the eye or eyedrops are given to make this a painless procedure.   A small cut (incision) is made on the edge of the clear, dome-shaped surface that covers the front of the eye (cornea).   A tiny probe is painlessly inserted into the eye. This device gives off ultrasound waves that soften and break up the cloudy center of the lens. This makes it easier for the cloudy lens to be removed by suction.   An IOL may be implanted.  The normal lens of the eye is covered by a clear capsule. Part of that capsule is intentionally left in the eye to support the IOL.   Your surgeon may or may not use stitches to close the incision.  There are other forms of cataract surgery that require a larger incision and stiches to close the eye. This approach is taken in cases where the doctor feels that the cataract cannot be easily removed using phacoemulsification. AFTER THE PROCEDURE  When an IOL is implanted, it does not need care. It becomes a permanent part of your eye and cannot be seen or felt.   Your doctor will schedule follow-up exams to check on your progress.   Review your other medicines  with your doctor to see which can be resumed after surgery.   Use eyedrops or take medicine as prescribed by your doctor.  Document Released: 02/05/2011 Document Reviewed: 02/02/2011 [facility] Patient Information 2012 [place].  .Cataract Surgery Care After Refer to this sheet in the next few weeks. These instructions provide you with information on caring for yourself after your procedure. Your caregiver may also give you more specific instructions. Your treatment has been planned according to current medical practices, but problems sometimes occur. Call your caregiver if you have any problems or questions after your procedure.  HOME CARE INSTRUCTIONS   Avoid strenuous activities as directed by your caregiver.   Ask your caregiver when you can resume driving.   Use eyedrops or other medicines to help healing and control pressure inside your eye as directed by your caregiver.   Only take over-the-counter or prescription medicines for pain, discomfort, or fever as directed by your caregiver.   Do not to touch or rub your eyes.   You may be instructed to use a protective shield during the first few days and nights after surgery. If not, wear sunglasses to protect your eyes. This is to protect the eye from pressure or from being accidentally bumped.   Keep the area around your eye clean and dry. Avoid swimming or allowing water to hit you directly in the face while showering. Keep soap and shampoo out of your eyes.   Do not bend or lift heavy objects. Bending increases pressure in the eye. You can walk, climb stairs, and do light household chores.   Do not put a contact lens into the eye that had surgery until your caregiver says it is okay to do so.   Ask your doctor when you can return to work. This will depend on the kind of work that you do. If you work in a dusty environment, you may be advised to wear protective eyewear for a period of time.   Ask your caregiver when it will  be safe to engage in sexual activity.   Continue with your regular eye exams as directed by your caregiver.  What to expect:  It is normal to feel itching and mild discomfort for a few days after cataract surgery. Some fluid discharge is also common, and your eye may be sensitive to light and touch.   After 1 to 2 days, even moderate discomfort should disappear. In most cases, healing will take about 6 weeks.   If you received an intraocular lens (IOL), you may notice that colors are very bright or have a blue tinge. Also, if you have been in bright sunlight, everything may appear reddish for a few hours. If you see these color tinges, it is because your lens is  clear and no longer cloudy. Within a few months after receiving an IOL, these extra colors should go away. When you have healed, you will probably need new glasses.  SEEK MEDICAL CARE IF:   You have increased bruising around your eye.   You have discomfort not helped by medicine.  SEEK IMMEDIATE MEDICAL CARE IF:   You have a fever.   You have a worsening or sudden vision loss.   You have redness, swelling, or increasing pain in the eye.   You have a thick discharge from the eye that had surgery.  MAKE SURE YOU:  Understand these instructions.   Will watch your condition.   Will get help right away if you are not doing well or get worse.  Document Released: 09/05/2004 Document Revised: 02/05/2011 Document Reviewed: 10/10/2010 [facility] Patient Information 2012 [place].    Monitored Anesthesia Care  Monitored anesthesia care is an anesthesia service for a medical procedure. Anesthesia is the loss of the ability to feel pain. It is produced by medications called anesthetics. It may affect a small area of your body (local anesthesia), a large area of your body (regional anesthesia), or your entire body (general anesthesia). The need for monitored anesthesia care depends your procedure, your condition, and the potential  need for regional or general anesthesia. It is often provided during procedures where:   General anesthesia may be needed if there are complications. This is because you need special care when you are under general anesthesia.   You will be under local or regional anesthesia. This is so that you are able to have higher levels of anesthesia if needed.   You will receive calming medications (sedatives). This is especially the case if sedatives are given to put you in a semi-conscious state of relaxation (deep sedation). This is because the amount of sedative needed to produce this state can be hard to predict. Too much of a sedative can produce general anesthesia. Monitored anesthesia care is performed by one or more caregivers who have special training in all types of anesthesia. You will need to meet with these caregivers before your procedure. During this meeting, they will ask you about your medical history. They will also give you instructions to follow. (For example, you will need to stop eating and drinking before your procedure. You may also need to stop or change medications you are taking.) During your procedure, your caregivers will stay with you. They will:   Watch your condition. This includes watching you blood pressure, breathing, and level of pain.   Diagnose and treat problems that occur.   Give medications if they are needed. These may include calming medications (sedatives) and anesthetics.   Make sure you are comfortable.  Having monitored anesthesia care does not necessarily mean that you will be under anesthesia. It does mean that your caregivers will be able to manage anesthesia if you need it or if it occurs. It also means that you will be able to have a different type of anesthesia than you are having if you need it. When your procedure is complete, your caregivers will continue to watch your condition. They will make sure any medications wear off before you are allowed  to go home.  Document Released: 11/12/2004 Document Revised: 06/13/2012 Document Reviewed: 03/30/2012 [facility] Patient Information 2014 [place], [place].

## 2017-12-03 ENCOUNTER — Ambulatory Visit (HOSPITAL_COMMUNITY)
Admission: RE | Admit: 2017-12-03 | Discharge: 2017-12-03 | Disposition: A | Discharge status: Home / Self Care | Payer: Medicare HMO | Source: Ambulatory Visit | Attending: Ophthalmology | Admitting: Ophthalmology

## 2017-12-03 ENCOUNTER — Encounter (HOSPITAL_COMMUNITY): Payer: Self-pay | Admitting: *Deleted

## 2017-12-03 ENCOUNTER — Encounter (HOSPITAL_COMMUNITY)
Admission: RE | Disposition: A | Discharge status: Home / Self Care | Payer: Self-pay | Source: Ambulatory Visit | Attending: Ophthalmology

## 2017-12-03 ENCOUNTER — Ambulatory Visit (HOSPITAL_COMMUNITY): Payer: Medicare HMO | Admitting: Anesthesiology

## 2017-12-03 DIAGNOSIS — Z7984 Long term (current) use of oral hypoglycemic drugs: Secondary | ICD-10-CM | POA: Diagnosis not present

## 2017-12-03 DIAGNOSIS — J449 Chronic obstructive pulmonary disease, unspecified: Secondary | ICD-10-CM | POA: Diagnosis not present

## 2017-12-03 DIAGNOSIS — K219 Gastro-esophageal reflux disease without esophagitis: Secondary | ICD-10-CM | POA: Insufficient documentation

## 2017-12-03 DIAGNOSIS — E7800 Pure hypercholesterolemia, unspecified: Secondary | ICD-10-CM | POA: Diagnosis not present

## 2017-12-03 DIAGNOSIS — Z79899 Other long term (current) drug therapy: Secondary | ICD-10-CM | POA: Insufficient documentation

## 2017-12-03 DIAGNOSIS — I10 Essential (primary) hypertension: Secondary | ICD-10-CM | POA: Diagnosis not present

## 2017-12-03 DIAGNOSIS — E119 Type 2 diabetes mellitus without complications: Secondary | ICD-10-CM | POA: Diagnosis not present

## 2017-12-03 DIAGNOSIS — H2511 Age-related nuclear cataract, right eye: Secondary | ICD-10-CM | POA: Diagnosis not present

## 2017-12-03 DIAGNOSIS — H2521 Age-related cataract, morgagnian type, right eye: Secondary | ICD-10-CM | POA: Insufficient documentation

## 2017-12-03 DIAGNOSIS — I2510 Atherosclerotic heart disease of native coronary artery without angina pectoris: Secondary | ICD-10-CM | POA: Diagnosis not present

## 2017-12-03 DIAGNOSIS — H25811 Combined forms of age-related cataract, right eye: Secondary | ICD-10-CM | POA: Diagnosis not present

## 2017-12-03 HISTORY — PX: CATARACT EXTRACTION W/PHACO: SHX586

## 2017-12-03 LAB — GLUCOSE, CAPILLARY: Glucose-Capillary: 87 mg/dL (ref 70–99)

## 2017-12-03 SURGERY — PHACOEMULSIFICATION, CATARACT, WITH IOL INSERTION
Anesthesia: Monitor Anesthesia Care | Site: Eye | Laterality: Right

## 2017-12-03 MED ORDER — TRYPAN BLUE 0.06 % OP SOLN
OPHTHALMIC | Status: AC
  Filled 2017-12-03: qty 0.5

## 2017-12-03 MED ORDER — LIDOCAINE HCL (PF) 1 % IJ SOLN
INTRAOCULAR | Status: DC | PRN
  Administered 2017-12-03: 1 mL via OPHTHALMIC

## 2017-12-03 MED ORDER — EPINEPHRINE PF 1 MG/ML IJ SOLN
INTRAOCULAR | Status: DC | PRN
  Administered 2017-12-03: 500 mL

## 2017-12-03 MED ORDER — BSS IO SOLN
INTRAOCULAR | Status: DC | PRN
  Administered 2017-12-03: 15 mL via INTRAOCULAR

## 2017-12-03 MED ORDER — PHENYLEPHRINE HCL 2.5 % OP SOLN
1.0000 [drp] | OPHTHALMIC | Status: AC
  Administered 2017-12-03 (×3): 1 [drp] via OPHTHALMIC

## 2017-12-03 MED ORDER — POVIDONE-IODINE 5 % OP SOLN
OPHTHALMIC | Status: DC | PRN
  Administered 2017-12-03: 1 via OPHTHALMIC

## 2017-12-03 MED ORDER — LIDOCAINE HCL 3.5 % OP GEL
1.0000 "application " | Freq: Once | OPHTHALMIC | Status: AC
  Administered 2017-12-03: 1 via OPHTHALMIC

## 2017-12-03 MED ORDER — PROVISC 10 MG/ML IO SOLN
INTRAOCULAR | Status: DC | PRN
  Administered 2017-12-03: 0.85 mL via INTRAOCULAR

## 2017-12-03 MED ORDER — NEOMYCIN-POLYMYXIN-DEXAMETH 3.5-10000-0.1 OP SUSP
OPHTHALMIC | Status: DC | PRN
  Administered 2017-12-03: 2 [drp] via OPHTHALMIC

## 2017-12-03 MED ORDER — MIDAZOLAM HCL 5 MG/5ML IJ SOLN
INTRAMUSCULAR | Status: DC | PRN
  Administered 2017-12-03: 2 mg via INTRAVENOUS

## 2017-12-03 MED ORDER — TETRACAINE HCL 0.5 % OP SOLN
1.0000 [drp] | OPHTHALMIC | Status: AC
  Administered 2017-12-03 (×3): 1 [drp] via OPHTHALMIC

## 2017-12-03 MED ORDER — LACTATED RINGERS IV SOLN
INTRAVENOUS | Status: DC
  Administered 2017-12-03: 11:00:00 via INTRAVENOUS

## 2017-12-03 MED ORDER — EPINEPHRINE PF 1 MG/ML IJ SOLN
INTRAMUSCULAR | Status: AC
  Filled 2017-12-03: qty 2

## 2017-12-03 MED ORDER — FENTANYL CITRATE (PF) 100 MCG/2ML IJ SOLN
INTRAMUSCULAR | Status: AC
  Filled 2017-12-03: qty 2

## 2017-12-03 MED ORDER — FENTANYL CITRATE (PF) 100 MCG/2ML IJ SOLN
INTRAMUSCULAR | Status: DC | PRN
  Administered 2017-12-03 (×2): 25 ug via INTRAVENOUS

## 2017-12-03 MED ORDER — MIDAZOLAM HCL 2 MG/2ML IJ SOLN
INTRAMUSCULAR | Status: AC
  Filled 2017-12-03: qty 2

## 2017-12-03 MED ORDER — CYCLOPENTOLATE-PHENYLEPHRINE 0.2-1 % OP SOLN
1.0000 [drp] | OPHTHALMIC | Status: AC
  Administered 2017-12-03 (×3): 1 [drp] via OPHTHALMIC

## 2017-12-03 MED ORDER — TRYPAN BLUE 0.06 % OP SOLN
OPHTHALMIC | Status: DC | PRN
  Administered 2017-12-03: 0.5 mL via INTRAOCULAR

## 2017-12-03 MED ORDER — SODIUM HYALURONATE 23 MG/ML IO SOLN
INTRAOCULAR | Status: DC | PRN
  Administered 2017-12-03: 0.6 mL via INTRAOCULAR

## 2017-12-03 SURGICAL SUPPLY — 14 items
CLOTH BEACON ORANGE TIMEOUT ST (SAFETY) ×1 IMPLANT
EYE SHIELD UNIVERSAL CLEAR (GAUZE/BANDAGES/DRESSINGS) ×1 IMPLANT
GLOVE BIOGEL PI IND STRL 7.0 (GLOVE) IMPLANT
GLOVE BIOGEL PI INDICATOR 7.0 (GLOVE) ×2
LENS ALC ACRYL/TECN (Ophthalmic Related) ×1 IMPLANT
NDL HYPO 18GX1.5 BLUNT FILL (NEEDLE) IMPLANT
NEEDLE HYPO 18GX1.5 BLUNT FILL (NEEDLE) ×2 IMPLANT
PAD ARMBOARD 7.5X6 YLW CONV (MISCELLANEOUS) ×1 IMPLANT
RING MALYGIN (MISCELLANEOUS) IMPLANT
SYR TB 1ML LL NO SAFETY (SYRINGE) ×1 IMPLANT
TAPE SURG TRANSPORE 1 IN (GAUZE/BANDAGES/DRESSINGS) IMPLANT
TAPE SURGICAL TRANSPORE 1 IN (GAUZE/BANDAGES/DRESSINGS) ×1
VISCOELASTIC ADDITIONAL (OPHTHALMIC RELATED) ×1 IMPLANT
WATER STERILE IRR 250ML POUR (IV SOLUTION) ×1 IMPLANT

## 2017-12-03 NOTE — Transfer of Care (Signed)
Immediate Anesthesia Transfer of Care Note  Patient: Roger Mccarty  Procedure(s) Performed: CATARACT EXTRACTION PHACO AND INTRAOCULAR LENS PLACEMENT (IOC) (Right Eye)  Patient Location: PACU  Anesthesia Type:MAC  Level of Consciousness: awake, alert  and oriented  Airway & Oxygen Therapy: Patient Spontanous Breathing  Post-op Assessment: Report given to RN and Post -op Vital signs reviewed and stable  Post vital signs: Reviewed and stable  Last Vitals:  Vitals Value Taken Time  BP    Temp    Pulse    Resp    SpO2      Last Pain:  Vitals:   12/03/17 0958  TempSrc: Oral  PainSc: 0-No pain      Patients Stated Pain Goal: 7 (19/41/74 0814)  Complications: No apparent anesthesia complications

## 2017-12-03 NOTE — Anesthesia Postprocedure Evaluation (Signed)
Anesthesia Post Note  Patient: Roger Mccarty  Procedure(s) Performed: CATARACT EXTRACTION PHACO AND INTRAOCULAR LENS PLACEMENT (IOC) (Right Eye)  Patient location during evaluation: PACU Anesthesia Type: MAC Level of consciousness: awake, awake and alert and oriented Pain management: pain level controlled Vital Signs Assessment: post-procedure vital signs reviewed and stable Respiratory status: spontaneous breathing and nonlabored ventilation Cardiovascular status: stable Postop Assessment: no apparent nausea or vomiting Anesthetic complications: no     Last Vitals:  Vitals:   12/03/17 0958 12/03/17 1149  BP: 124/60 (!) 122/47  Pulse:  76  Resp: 15   Temp: (!) 36.4 C (!) 36.4 C  SpO2: 96% 97%    Last Pain:  Vitals:   12/03/17 1149  TempSrc: Oral  PainSc: 0-No pain                 [name]

## 2017-12-03 NOTE — Discharge Instructions (Signed)
Please discharge patient when stable, will follow up today with Dr. Jalana Moore at the [facility] office immediately following discharge.  Leave shield in place until visit.  All paperwork with discharge instructions will be given at the office. ° °

## 2017-12-03 NOTE — H&P (Signed)
The H and P was reviewed and updated. The patient was examined.  No changes were found after exam.  The surgical eye was marked.  

## 2017-12-03 NOTE — Anesthesia Preprocedure Evaluation (Addendum)
Anesthesia Evaluation  Patient identified by MRN, date of birth, ID band Patient awake    Reviewed: Allergy & Precautions, H&P , NPO status , Patient's Chart, lab work & pertinent test results, reviewed documented beta blocker date and time   Airway Mallampati: II  TM Distance: >3 FB Neck ROM: full    Dental no notable dental hx. (+) Edentulous Upper, Edentulous Lower   Pulmonary neg pulmonary ROS, COPD, Current Smoker,    Pulmonary exam normal breath sounds clear to auscultation       Cardiovascular Exercise Tolerance: Good hypertension, + CAD  negative cardio ROS   Rhythm:regular Rate:Normal     Neuro/Psych negative neurological ROS  negative psych ROS   GI/Hepatic negative GI ROS, Neg liver ROS, GERD  ,  Endo/Other  negative endocrine ROSdiabetes, Type 2  Renal/GU Renal diseasenegative Renal ROS  negative genitourinary   Musculoskeletal   Abdominal   Peds  Hematology negative hematology ROS (+)   Anesthesia Other Findings COPD, inhalers, no home oxygen- but may need it? ambluates with walker, denies CVA    Reproductive/Obstetrics negative OB ROS                             Anesthesia Physical Anesthesia Plan  ASA: IV  Anesthesia Plan: MAC   Post-op Pain Management:    Induction:   PONV Risk Score and Plan:   Airway Management Planned:   Additional Equipment:   Intra-op Plan:   Post-operative Plan:   Informed Consent: I have reviewed the patients History and Physical, chart, labs and discussed the procedure including the risks, benefits and alternatives for the proposed anesthesia with the patient or authorized representative who has indicated his/her understanding and acceptance.   Dental Advisory Given  Plan Discussed with: CRNA and Anesthesiologist  Anesthesia Plan Comments:         Anesthesia Quick Evaluation

## 2017-12-03 NOTE — Op Note (Signed)
Date of procedure: 12/03/17  Pre-operative diagnosis: Mature Visually significant cataract, Right Eye (H25.21)  Post-operative diagnosis: Mature Visually significant cataract, Right Eye  Procedure: Removal of cataract via phacoemulsification and insertion of intra-ocular lens AMO PCB00 +19.0D into the capsular bag of the Right Eye  Attending surgeon: Gerda Diss. Shaelee Forni, MD, MA  Anesthesia: MAC, Topical Akten  Complications: None  Estimated Blood Loss: <68m (minimal)  Specimens: None  Implants: As above  Indications:  Visually significant cataract, Right Eye  Procedure:  The patient was seen and identified in the pre-operative area. The operative eye was identified and dilated.  The operative eye was marked.  Topical anesthesia was administered to the operative eye.     The patient was then to the operative suite and placed in the supine position.  A timeout was performed confirming the patient, procedure to be performed, and all other relevant information.   The patient's face was prepped and draped in the usual fashion for intra-ocular surgery.  A lid speculum was placed into the operative eye and the surgical microscope moved into place and focused.  A lack of red reflex due to a mature cataract was confirmed.  A superotemporal paracentesis was created using a 20 gauge paracentesis blade.  Vision blue was injected into the anterior chamber.  Shugarcaine was injected into the anterior chamber.  Viscoelastic was injected into the anterior chamber.  A temporal clear-corneal main wound incision was created using a 2.475mmicrokeratome.  A continuous curvilinear capsulorrhexis was initiated using an irrigating cystitome and completed using capsulorrhexis forceps.  Hydrodissection and hydrodeliniation were performed.  Viscoelastic was injected into the anterior chamber.  A phacoemulsification handpiece and a chopper as a second instrument were used to remove the nucleus and epinucleus. The  irrigation/aspiration handpiece was used to remove any remaining cortical material.   The capsular bag was reinflated with viscoelastic, checked, and found to be intact. The intraocular lens was inserted into the capsular bag and dialed into place using a kuglen hook.  The irrigation/aspiration handpiece was used to remove any remaining viscoelastic.  The clear corneal wound and paracentesis wounds were then hydrated and checked with Weck-Cels to be watertight.  The lid-speculum and drape was removed, and the patient's face was cleaned with a wet and dry 4x4.  Maxitrol was instilled in the eye before a clear shield was taped over the eye. The patient was taken to the post-operative care unit in good condition, having tolerated the procedure well.  Post-Op Instructions: [facility] and will receive all other orders and instructions.

## 2017-12-06 ENCOUNTER — Encounter (HOSPITAL_COMMUNITY): Payer: Self-pay | Admitting: Ophthalmology

## 2017-12-20 DIAGNOSIS — N182 Chronic kidney disease, stage 2 (mild): Secondary | ICD-10-CM | POA: Diagnosis not present

## 2017-12-20 DIAGNOSIS — M545 Low back pain: Secondary | ICD-10-CM | POA: Diagnosis not present

## 2017-12-20 DIAGNOSIS — I10 Essential (primary) hypertension: Secondary | ICD-10-CM | POA: Diagnosis not present

## 2017-12-20 DIAGNOSIS — Z6825 Body mass index (BMI) 25.0-25.9, adult: Secondary | ICD-10-CM | POA: Diagnosis not present

## 2017-12-20 DIAGNOSIS — K210 Gastro-esophageal reflux disease with esophagitis: Secondary | ICD-10-CM | POA: Diagnosis not present

## 2017-12-20 DIAGNOSIS — E1122 Type 2 diabetes mellitus with diabetic chronic kidney disease: Secondary | ICD-10-CM | POA: Diagnosis not present

## 2017-12-23 DIAGNOSIS — H2522 Age-related cataract, morgagnian type, left eye: Secondary | ICD-10-CM | POA: Diagnosis not present

## 2017-12-28 ENCOUNTER — Encounter (HOSPITAL_COMMUNITY)
Admission: RE | Admit: 2017-12-28 | Discharge: 2017-12-28 | Disposition: A | Discharge status: Home / Self Care | Payer: Medicare HMO | Source: Ambulatory Visit | Attending: Ophthalmology | Admitting: Ophthalmology

## 2017-12-28 ENCOUNTER — Encounter (HOSPITAL_COMMUNITY): Payer: Self-pay

## 2017-12-31 ENCOUNTER — Ambulatory Visit (HOSPITAL_COMMUNITY)
Admission: RE | Admit: 2017-12-31 | Discharge: 2017-12-31 | Disposition: A | Discharge status: Home / Self Care | Payer: Medicare HMO | Source: Ambulatory Visit | Attending: Ophthalmology | Admitting: Ophthalmology

## 2017-12-31 ENCOUNTER — Ambulatory Visit (HOSPITAL_COMMUNITY): Payer: Medicare HMO | Admitting: Anesthesiology

## 2017-12-31 ENCOUNTER — Encounter (HOSPITAL_COMMUNITY): Payer: Self-pay | Admitting: Anesthesiology

## 2017-12-31 ENCOUNTER — Encounter (HOSPITAL_COMMUNITY)
Admission: RE | Disposition: A | Discharge status: Home / Self Care | Payer: Self-pay | Source: Ambulatory Visit | Attending: Ophthalmology

## 2017-12-31 DIAGNOSIS — J449 Chronic obstructive pulmonary disease, unspecified: Secondary | ICD-10-CM | POA: Insufficient documentation

## 2017-12-31 DIAGNOSIS — I10 Essential (primary) hypertension: Secondary | ICD-10-CM | POA: Insufficient documentation

## 2017-12-31 DIAGNOSIS — H2522 Age-related cataract, morgagnian type, left eye: Secondary | ICD-10-CM | POA: Diagnosis not present

## 2017-12-31 DIAGNOSIS — E7800 Pure hypercholesterolemia, unspecified: Secondary | ICD-10-CM | POA: Insufficient documentation

## 2017-12-31 DIAGNOSIS — Z79899 Other long term (current) drug therapy: Secondary | ICD-10-CM | POA: Insufficient documentation

## 2017-12-31 DIAGNOSIS — I252 Old myocardial infarction: Secondary | ICD-10-CM | POA: Diagnosis not present

## 2017-12-31 DIAGNOSIS — E119 Type 2 diabetes mellitus without complications: Secondary | ICD-10-CM | POA: Diagnosis not present

## 2017-12-31 DIAGNOSIS — H2511 Age-related nuclear cataract, right eye: Secondary | ICD-10-CM | POA: Diagnosis not present

## 2017-12-31 DIAGNOSIS — Z7984 Long term (current) use of oral hypoglycemic drugs: Secondary | ICD-10-CM | POA: Insufficient documentation

## 2017-12-31 DIAGNOSIS — I2510 Atherosclerotic heart disease of native coronary artery without angina pectoris: Secondary | ICD-10-CM | POA: Diagnosis not present

## 2017-12-31 DIAGNOSIS — K219 Gastro-esophageal reflux disease without esophagitis: Secondary | ICD-10-CM | POA: Diagnosis not present

## 2017-12-31 DIAGNOSIS — H2512 Age-related nuclear cataract, left eye: Secondary | ICD-10-CM | POA: Diagnosis not present

## 2017-12-31 HISTORY — PX: CATARACT EXTRACTION W/PHACO: SHX586

## 2017-12-31 LAB — GLUCOSE, CAPILLARY: Glucose-Capillary: 98 mg/dL (ref 70–99)

## 2017-12-31 SURGERY — PHACOEMULSIFICATION, CATARACT, WITH IOL INSERTION
Anesthesia: Monitor Anesthesia Care | Site: Eye | Laterality: Left

## 2017-12-31 MED ORDER — TRYPAN BLUE 0.06 % OP SOLN
OPHTHALMIC | Status: DC | PRN
  Administered 2017-12-31: 0.5 mL via INTRAOCULAR

## 2017-12-31 MED ORDER — CYCLOPENTOLATE-PHENYLEPHRINE 0.2-1 % OP SOLN
1.0000 [drp] | OPHTHALMIC | Status: AC
  Administered 2017-12-31 (×3): 1 [drp] via OPHTHALMIC

## 2017-12-31 MED ORDER — BSS IO SOLN
INTRAOCULAR | Status: DC | PRN
  Administered 2017-12-31 (×2): 15 mL via INTRAOCULAR

## 2017-12-31 MED ORDER — MIDAZOLAM HCL 5 MG/5ML IJ SOLN
INTRAMUSCULAR | Status: DC | PRN
  Administered 2017-12-31 (×2): 1 mg via INTRAVENOUS

## 2017-12-31 MED ORDER — EPINEPHRINE PF 1 MG/ML IJ SOLN
INTRAOCULAR | Status: DC | PRN
  Administered 2017-12-31: 500 mL

## 2017-12-31 MED ORDER — TRYPAN BLUE 0.06 % OP SOLN
OPHTHALMIC | Status: AC
  Filled 2017-12-31: qty 0.5

## 2017-12-31 MED ORDER — PROVISC 10 MG/ML IO SOLN
INTRAOCULAR | Status: DC | PRN
  Administered 2017-12-31: 0.85 mL via INTRAOCULAR

## 2017-12-31 MED ORDER — EPINEPHRINE PF 1 MG/ML IJ SOLN
INTRAMUSCULAR | Status: AC
  Filled 2017-12-31: qty 2

## 2017-12-31 MED ORDER — POVIDONE-IODINE 5 % OP SOLN
OPHTHALMIC | Status: DC | PRN
  Administered 2017-12-31: 1 via OPHTHALMIC

## 2017-12-31 MED ORDER — LIDOCAINE HCL (PF) 1 % IJ SOLN
INTRAOCULAR | Status: DC | PRN
  Administered 2017-12-31: 1 mL via OPHTHALMIC

## 2017-12-31 MED ORDER — TETRACAINE HCL 0.5 % OP SOLN
1.0000 [drp] | OPHTHALMIC | Status: AC
  Administered 2017-12-31 (×3): 1 [drp] via OPHTHALMIC

## 2017-12-31 MED ORDER — NEOMYCIN-POLYMYXIN-DEXAMETH 3.5-10000-0.1 OP SUSP
OPHTHALMIC | Status: DC | PRN
  Administered 2017-12-31: 1 [drp] via OPHTHALMIC

## 2017-12-31 MED ORDER — PHENYLEPHRINE HCL 2.5 % OP SOLN
1.0000 [drp] | OPHTHALMIC | Status: AC
  Administered 2017-12-31 (×3): 1 [drp] via OPHTHALMIC

## 2017-12-31 MED ORDER — SODIUM HYALURONATE 23 MG/ML IO SOLN
INTRAOCULAR | Status: DC | PRN
  Administered 2017-12-31: 0.6 mL via INTRAOCULAR

## 2017-12-31 MED ORDER — LIDOCAINE HCL 3.5 % OP GEL
1.0000 "application " | Freq: Once | OPHTHALMIC | Status: AC
  Administered 2017-12-31: 1 via OPHTHALMIC

## 2017-12-31 MED ORDER — LACTATED RINGERS IV SOLN
INTRAVENOUS | Status: DC | PRN
  Administered 2017-12-31: 10:00:00 via INTRAVENOUS

## 2017-12-31 SURGICAL SUPPLY — 18 items
CLOTH BEACON ORANGE TIMEOUT ST (SAFETY) ×1 IMPLANT
EYE SHIELD UNIVERSAL CLEAR (GAUZE/BANDAGES/DRESSINGS) ×1 IMPLANT
GLOVE BIOGEL PI IND STRL 6.5 (GLOVE) IMPLANT
GLOVE BIOGEL PI IND STRL 7.0 (GLOVE) IMPLANT
GLOVE BIOGEL PI IND STRL 7.5 (GLOVE) IMPLANT
GLOVE BIOGEL PI INDICATOR 6.5 (GLOVE)
GLOVE BIOGEL PI INDICATOR 7.0 (GLOVE) ×2
GLOVE BIOGEL PI INDICATOR 7.5 (GLOVE) ×1
LENS ALC ACRYL/TECN (Ophthalmic Related) ×1 IMPLANT
NDL HYPO 18GX1.5 BLUNT FILL (NEEDLE) IMPLANT
NEEDLE HYPO 18GX1.5 BLUNT FILL (NEEDLE) ×2 IMPLANT
PAD ARMBOARD 7.5X6 YLW CONV (MISCELLANEOUS) ×1 IMPLANT
RING MALYGIN (MISCELLANEOUS) IMPLANT
SYR TB 1ML LL NO SAFETY (SYRINGE) ×1 IMPLANT
TAPE SURG TRANSPORE 1 IN (GAUZE/BANDAGES/DRESSINGS) IMPLANT
TAPE SURGICAL TRANSPORE 1 IN (GAUZE/BANDAGES/DRESSINGS) ×1
VISCOELASTIC ADDITIONAL (OPHTHALMIC RELATED) ×1 IMPLANT
WATER STERILE IRR 250ML POUR (IV SOLUTION) ×1 IMPLANT

## 2017-12-31 NOTE — Op Note (Signed)
Date of procedure: 12/31/17  Pre-operative diagnosis: Mature Visually significant cataract, Left Eye (H25.22)  Post-operative diagnosis: Mature Visually significant cataract, Left Eye  Procedure: Complex Removal of cataract via phacoemulsification and insertion of intra-ocular lens [employer]  +21.5D into the capsular bag of the Left Eye  Attending surgeon: Gerda Diss. Eldonna Neuenfeldt, MD, MA  Anesthesia: MAC, Topical Akten  Complications: None  Estimated Blood Loss: <56m (minimal)  Specimens: None  Implants: As above  Indications:  Complex Visually significant cataract, Left Eye  Procedure:  The patient was seen and identified in the pre-operative area. The operative eye was identified and dilated.  The operative eye was marked.  Topical anesthesia was administered to the operative eye.     The patient was then to the operative suite and placed in the supine position.  A timeout was performed confirming the patient, procedure to be performed, and all other relevant information.   The patient's face was prepped and draped in the usual fashion for intra-ocular surgery.  A lid speculum was placed into the operative eye and the surgical microscope moved into place and focused.  There was no red reflex due to a mature cataract.  An inferotemporal paracentesis was created using a 20 gauge paracentesis blade.  Vision Blue was used to stain the anterior capsule.  Shugarcaine was injected into the anterior chamber.  Viscoelastic was injected into the anterior chamber.  A temporal clear-corneal main wound incision was created using a 2.430mmicrokeratome.  A continuous curvilinear capsulorrhexis was initiated using an irrigating cystitome and completed using capsulorrhexis forceps.  Hydrodissection and hydrodeliniation were performed.  Viscoelastic was injected into the anterior chamber.  A phacoemulsification handpiece and a chopper as a second instrument were used to remove the nucleus  and epinucleus. The irrigation/aspiration handpiece was used to remove any remaining cortical material.   The capsular bag was reinflated with viscoelastic, checked, and found to be intact.  The intraocular lens was inserted into the capsular bag and dialed into place using a Kuglen hook.  The irrigation/aspiration handpiece was used to remove any remaining viscoelastic.  The clear corneal wound and paracentesis wounds were then hydrated and checked with Weck-Cels to be watertight.  The lid-speculum and drape was removed, and the patient's face was cleaned with a wet and dry 4x4.  Maxitrol was instilled in the eye before a clear shield was taped over the eye. The patient was taken to the post-operative care unit in good condition, having tolerated the procedure well.  Post-Op Instructions: [facility] and will receive all other orders and instructions.

## 2017-12-31 NOTE — H&P (Signed)
The H and P was reviewed and updated. The patient was examined.  No changes were found after exam.  The surgical eye was marked.  

## 2017-12-31 NOTE — Anesthesia Postprocedure Evaluation (Signed)
Anesthesia Post Note  Patient: Roger Mccarty  Procedure(s) Performed: CATARACT EXTRACTION PHACO AND INTRAOCULAR LENS PLACEMENT (IOC) LEFT CDE: 21.61 (Left Eye)  Patient location during evaluation: Short Stay Anesthesia Type: MAC Level of consciousness: awake and alert and oriented Pain management: pain level controlled Vital Signs Assessment: post-procedure vital signs reviewed and stable Respiratory status: spontaneous breathing Cardiovascular status: blood pressure returned to baseline and stable Postop Assessment: no apparent nausea or vomiting Anesthetic complications: no     Last Vitals:  Vitals:   12/31/17 0938  BP: 132/69  Pulse: 90  Resp: 18  Temp: 36.5 C  SpO2: 95%    Last Pain:  Vitals:   12/31/17 0938  PainSc: 0-No pain                 Jadi Deyarmin

## 2017-12-31 NOTE — Anesthesia Preprocedure Evaluation (Signed)
Anesthesia Evaluation  Patient identified by MRN, date of birth, ID band Patient awake    Reviewed: Allergy & Precautions, NPO status , Patient's Chart, lab work & pertinent test results  Airway Mallampati: II  TM Distance: >3 FB Neck ROM: Full    Dental no notable dental hx. (+) Edentulous Upper, Edentulous Lower   Pulmonary COPD, Current Smoker,  States known COPD -continues with smoking  States uses Spiriva  Denies ever using rescue inhalers or o2 use    Pulmonary exam normal  + decreased breath sounds      Cardiovascular Exercise Tolerance: Poor hypertension, + CAD and + Past MI  Normal cardiovascular examII Rhythm:Regular Rate:Normal  States MI in 2017  Denies stents or other interventions  Denies recent CP Denies NTG use  Limited ET LBP issues /COPD   Neuro/Psych negative neurological ROS  negative psych ROS   GI/Hepatic Neg liver ROS, GERD  Medicated and Controlled,  Endo/Other  negative endocrine ROSdiabetes  Renal/GU Renal InsufficiencyRenal disease  negative genitourinary   Musculoskeletal negative musculoskeletal ROS (+)   Abdominal   Peds negative pediatric ROS (+)  Hematology negative hematology ROS (+)   Anesthesia Other Findings   Reproductive/Obstetrics negative OB ROS                             Anesthesia Physical Anesthesia Plan  ASA: III  Anesthesia Plan: MAC   Post-op Pain Management:    Induction: Intravenous  PONV Risk Score and Plan:   Airway Management Planned: Nasal Cannula and Simple Face Mask  Additional Equipment:   Intra-op Plan:   Post-operative Plan:   Informed Consent: I have reviewed the patients History and Physical, chart, labs and discussed the procedure including the risks, benefits and alternatives for the proposed anesthesia with the patient or authorized representative who has indicated his/her understanding and acceptance.    Dental advisory given  Plan Discussed with: CRNA  Anesthesia Plan Comments:         Anesthesia Quick Evaluation

## 2017-12-31 NOTE — Discharge Instructions (Signed)
Please discharge patient when stable, will follow up today with Dr. Amil Bouwman at the [facility] office immediately following discharge.  Leave shield in place until visit.  All paperwork with discharge instructions will be given at the office. ° ° °Monitored Anesthesia Care, Care After °These instructions provide you with information about caring for yourself after your procedure. Your health care provider may also give you more specific instructions. Your treatment has been planned according to current medical practices, but problems sometimes occur. Call your health care provider if you have any problems or questions after your procedure. °What can I expect after the procedure? °After your procedure, it is common to: °· Feel sleepy for several hours. °· Feel clumsy and have poor balance for several hours. °· Feel forgetful about what happened after the procedure. °· Have poor judgment for several hours. °· Feel nauseous or vomit. °· Have a sore throat if you had a breathing tube during the procedure. ° °Follow these instructions at home: °For at least 24 hours after the procedure: ° °· Do not: °? Participate in activities in which you could fall or become injured. °? Drive. °? Use heavy machinery. °? Drink alcohol. °? Take sleeping pills or medicines that cause drowsiness. °? Make important decisions or sign legal documents. °? Take care of children on your own. °· Rest. °Eating and drinking °· Follow the diet that is recommended by your health care provider. °· If you vomit, drink water, juice, or soup when you can drink without vomiting. °· Make sure you have little or no nausea before eating solid foods. °General instructions °· Have a responsible adult stay with you until you are awake and alert. °· Take over-the-counter and prescription medicines only as told by your health care provider. °· If you smoke, do not smoke without supervision. °· Keep all follow-up visits as told by your health care  provider. This is important. °Contact a health care provider if: °· You keep feeling nauseous or you keep vomiting. °· You feel light-headed. °· You develop a rash. °· You have a fever. °Get help right away if: °· You have trouble breathing. °This information is not intended to replace advice given to you by your health care provider. Make sure you discuss any questions you have with your health care provider. °Document Released: 06/09/2015 Document Revised: 10/09/2015 Document Reviewed: 06/09/2015 °Elsevier Interactive Patient Education © 2018 [employer] ° °

## 2017-12-31 NOTE — Transfer of Care (Signed)
Immediate Anesthesia Transfer of Care Note  Patient: Roger Mccarty  Procedure(s) Performed: CATARACT EXTRACTION PHACO AND INTRAOCULAR LENS PLACEMENT (IOC) LEFT CDE: 21.61 (Left Eye)  Patient Location: Short Stay  Anesthesia Type:MAC  Level of Consciousness: awake  Airway & Oxygen Therapy: Patient Spontanous Breathing  Post-op Assessment: Report given to RN  Post vital signs: Reviewed  Last Vitals:  Vitals Value Taken Time  BP    Temp    Pulse    Resp    SpO2      Last Pain:  Vitals:   12/31/17 0938  PainSc: 0-No pain         Complications: No apparent anesthesia complications

## 2018-01-03 ENCOUNTER — Encounter (HOSPITAL_COMMUNITY): Payer: Self-pay | Admitting: Ophthalmology

## 2018-01-13 DIAGNOSIS — R69 Illness, unspecified: Secondary | ICD-10-CM | POA: Diagnosis not present

## 2018-01-24 DIAGNOSIS — Z7982 Long term (current) use of aspirin: Secondary | ICD-10-CM | POA: Diagnosis not present

## 2018-01-24 DIAGNOSIS — E785 Hyperlipidemia, unspecified: Secondary | ICD-10-CM | POA: Diagnosis not present

## 2018-01-24 DIAGNOSIS — I10 Essential (primary) hypertension: Secondary | ICD-10-CM | POA: Diagnosis not present

## 2018-01-24 DIAGNOSIS — J449 Chronic obstructive pulmonary disease, unspecified: Secondary | ICD-10-CM | POA: Diagnosis not present

## 2018-01-24 DIAGNOSIS — E1142 Type 2 diabetes mellitus with diabetic polyneuropathy: Secondary | ICD-10-CM | POA: Diagnosis not present

## 2018-01-24 DIAGNOSIS — R32 Unspecified urinary incontinence: Secondary | ICD-10-CM | POA: Diagnosis not present

## 2018-01-24 DIAGNOSIS — R69 Illness, unspecified: Secondary | ICD-10-CM | POA: Diagnosis not present

## 2018-01-24 DIAGNOSIS — I252 Old myocardial infarction: Secondary | ICD-10-CM | POA: Diagnosis not present

## 2018-01-24 DIAGNOSIS — G8929 Other chronic pain: Secondary | ICD-10-CM | POA: Diagnosis not present

## 2018-01-26 DIAGNOSIS — E1122 Type 2 diabetes mellitus with diabetic chronic kidney disease: Secondary | ICD-10-CM | POA: Diagnosis not present

## 2018-01-26 DIAGNOSIS — I10 Essential (primary) hypertension: Secondary | ICD-10-CM | POA: Diagnosis not present

## 2018-01-26 DIAGNOSIS — M545 Low back pain: Secondary | ICD-10-CM | POA: Diagnosis not present

## 2018-01-26 DIAGNOSIS — N182 Chronic kidney disease, stage 2 (mild): Secondary | ICD-10-CM | POA: Diagnosis not present

## 2018-01-26 DIAGNOSIS — K210 Gastro-esophageal reflux disease with esophagitis: Secondary | ICD-10-CM | POA: Diagnosis not present

## 2018-02-16 DIAGNOSIS — I10 Essential (primary) hypertension: Secondary | ICD-10-CM | POA: Diagnosis not present

## 2018-02-16 DIAGNOSIS — N182 Chronic kidney disease, stage 2 (mild): Secondary | ICD-10-CM | POA: Diagnosis not present

## 2018-02-16 DIAGNOSIS — M545 Low back pain: Secondary | ICD-10-CM | POA: Diagnosis not present

## 2018-02-16 DIAGNOSIS — Z6826 Body mass index (BMI) 26.0-26.9, adult: Secondary | ICD-10-CM | POA: Diagnosis not present

## 2018-02-16 DIAGNOSIS — E1122 Type 2 diabetes mellitus with diabetic chronic kidney disease: Secondary | ICD-10-CM | POA: Diagnosis not present

## 2018-02-16 DIAGNOSIS — K210 Gastro-esophageal reflux disease with esophagitis: Secondary | ICD-10-CM | POA: Diagnosis not present

## 2018-02-24 DIAGNOSIS — I10 Essential (primary) hypertension: Secondary | ICD-10-CM | POA: Diagnosis not present

## 2018-02-24 DIAGNOSIS — N182 Chronic kidney disease, stage 2 (mild): Secondary | ICD-10-CM | POA: Diagnosis not present

## 2018-02-24 DIAGNOSIS — M545 Low back pain: Secondary | ICD-10-CM | POA: Diagnosis not present

## 2018-02-24 DIAGNOSIS — E1122 Type 2 diabetes mellitus with diabetic chronic kidney disease: Secondary | ICD-10-CM | POA: Diagnosis not present

## 2018-02-24 DIAGNOSIS — K210 Gastro-esophageal reflux disease with esophagitis: Secondary | ICD-10-CM | POA: Diagnosis not present

## 2018-04-05 DIAGNOSIS — I10 Essential (primary) hypertension: Secondary | ICD-10-CM | POA: Diagnosis not present

## 2018-04-05 DIAGNOSIS — E1122 Type 2 diabetes mellitus with diabetic chronic kidney disease: Secondary | ICD-10-CM | POA: Diagnosis not present

## 2018-04-05 DIAGNOSIS — K210 Gastro-esophageal reflux disease with esophagitis: Secondary | ICD-10-CM | POA: Diagnosis not present

## 2018-04-05 DIAGNOSIS — N182 Chronic kidney disease, stage 2 (mild): Secondary | ICD-10-CM | POA: Diagnosis not present

## 2018-04-05 DIAGNOSIS — M545 Low back pain: Secondary | ICD-10-CM | POA: Diagnosis not present

## 2018-04-19 DIAGNOSIS — E1122 Type 2 diabetes mellitus with diabetic chronic kidney disease: Secondary | ICD-10-CM | POA: Diagnosis not present

## 2018-04-19 DIAGNOSIS — I10 Essential (primary) hypertension: Secondary | ICD-10-CM | POA: Diagnosis not present

## 2018-04-19 DIAGNOSIS — N182 Chronic kidney disease, stage 2 (mild): Secondary | ICD-10-CM | POA: Diagnosis not present

## 2018-04-19 DIAGNOSIS — K210 Gastro-esophageal reflux disease with esophagitis: Secondary | ICD-10-CM | POA: Diagnosis not present

## 2018-04-19 DIAGNOSIS — M545 Low back pain: Secondary | ICD-10-CM | POA: Diagnosis not present

## 2018-04-19 DIAGNOSIS — Z6826 Body mass index (BMI) 26.0-26.9, adult: Secondary | ICD-10-CM | POA: Diagnosis not present

## 2018-06-03 DIAGNOSIS — I10 Essential (primary) hypertension: Secondary | ICD-10-CM | POA: Diagnosis not present

## 2018-06-03 DIAGNOSIS — N182 Chronic kidney disease, stage 2 (mild): Secondary | ICD-10-CM | POA: Diagnosis not present

## 2018-06-03 DIAGNOSIS — E1122 Type 2 diabetes mellitus with diabetic chronic kidney disease: Secondary | ICD-10-CM | POA: Diagnosis not present

## 2018-06-03 DIAGNOSIS — M545 Low back pain: Secondary | ICD-10-CM | POA: Diagnosis not present

## 2018-06-03 DIAGNOSIS — K210 Gastro-esophageal reflux disease with esophagitis: Secondary | ICD-10-CM | POA: Diagnosis not present

## 2018-06-27 DIAGNOSIS — M545 Low back pain: Secondary | ICD-10-CM | POA: Diagnosis not present

## 2018-06-27 DIAGNOSIS — I10 Essential (primary) hypertension: Secondary | ICD-10-CM | POA: Diagnosis not present

## 2018-06-27 DIAGNOSIS — N182 Chronic kidney disease, stage 2 (mild): Secondary | ICD-10-CM | POA: Diagnosis not present

## 2018-06-27 DIAGNOSIS — E1122 Type 2 diabetes mellitus with diabetic chronic kidney disease: Secondary | ICD-10-CM | POA: Diagnosis not present

## 2018-06-27 DIAGNOSIS — K210 Gastro-esophageal reflux disease with esophagitis: Secondary | ICD-10-CM | POA: Diagnosis not present

## 2018-06-27 DIAGNOSIS — Z6827 Body mass index (BMI) 27.0-27.9, adult: Secondary | ICD-10-CM | POA: Diagnosis not present

## 2018-07-01 DIAGNOSIS — E1122 Type 2 diabetes mellitus with diabetic chronic kidney disease: Secondary | ICD-10-CM | POA: Diagnosis not present

## 2018-07-01 DIAGNOSIS — M545 Low back pain: Secondary | ICD-10-CM | POA: Diagnosis not present

## 2018-07-01 DIAGNOSIS — K210 Gastro-esophageal reflux disease with esophagitis: Secondary | ICD-10-CM | POA: Diagnosis not present

## 2018-07-01 DIAGNOSIS — I10 Essential (primary) hypertension: Secondary | ICD-10-CM | POA: Diagnosis not present

## 2018-07-01 DIAGNOSIS — N182 Chronic kidney disease, stage 2 (mild): Secondary | ICD-10-CM | POA: Diagnosis not present

## 2018-08-04 DIAGNOSIS — E1122 Type 2 diabetes mellitus with diabetic chronic kidney disease: Secondary | ICD-10-CM | POA: Diagnosis not present

## 2018-08-04 DIAGNOSIS — K210 Gastro-esophageal reflux disease with esophagitis: Secondary | ICD-10-CM | POA: Diagnosis not present

## 2018-08-04 DIAGNOSIS — I10 Essential (primary) hypertension: Secondary | ICD-10-CM | POA: Diagnosis not present

## 2018-08-04 DIAGNOSIS — N182 Chronic kidney disease, stage 2 (mild): Secondary | ICD-10-CM | POA: Diagnosis not present

## 2018-08-04 DIAGNOSIS — M545 Low back pain: Secondary | ICD-10-CM | POA: Diagnosis not present

## 2018-08-29 DIAGNOSIS — M545 Low back pain: Secondary | ICD-10-CM | POA: Diagnosis not present

## 2018-08-29 DIAGNOSIS — E1122 Type 2 diabetes mellitus with diabetic chronic kidney disease: Secondary | ICD-10-CM | POA: Diagnosis not present

## 2018-08-29 DIAGNOSIS — Z6828 Body mass index (BMI) 28.0-28.9, adult: Secondary | ICD-10-CM | POA: Diagnosis not present

## 2018-08-29 DIAGNOSIS — Z1159 Encounter for screening for other viral diseases: Secondary | ICD-10-CM | POA: Diagnosis not present

## 2018-08-29 DIAGNOSIS — K210 Gastro-esophageal reflux disease with esophagitis: Secondary | ICD-10-CM | POA: Diagnosis not present

## 2018-08-29 DIAGNOSIS — I10 Essential (primary) hypertension: Secondary | ICD-10-CM | POA: Diagnosis not present

## 2018-08-29 DIAGNOSIS — N182 Chronic kidney disease, stage 2 (mild): Secondary | ICD-10-CM | POA: Diagnosis not present

## 2018-09-15 DIAGNOSIS — M545 Low back pain: Secondary | ICD-10-CM | POA: Diagnosis not present

## 2018-09-15 DIAGNOSIS — N182 Chronic kidney disease, stage 2 (mild): Secondary | ICD-10-CM | POA: Diagnosis not present

## 2018-09-15 DIAGNOSIS — E1122 Type 2 diabetes mellitus with diabetic chronic kidney disease: Secondary | ICD-10-CM | POA: Diagnosis not present

## 2018-09-15 DIAGNOSIS — I10 Essential (primary) hypertension: Secondary | ICD-10-CM | POA: Diagnosis not present

## 2018-09-15 DIAGNOSIS — K210 Gastro-esophageal reflux disease with esophagitis: Secondary | ICD-10-CM | POA: Diagnosis not present
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
## 2018-10-21 IMAGING — CT CT HEAD W/O CM
3 series · 15 of 47 positions shown, 18 images · non-contrast
Comparison: 11/13/2014

CLINICAL DATA: Per ED notes: Pt was brought over from [facility] . Pt
almost fell and had near syncopal episode while registering at [facility]. Needed pre op for cataract. BP 75/48 when checked by staff. Pt
reports dizziness when standing up.

EXAM:
CT HEAD WITHOUT CONTRAST
TECHNIQUE: Contiguous axial images were obtained from the base of the skull
through the vertex without intravenous contrast.

[Series 2: head wo · axial · 0.49mm/px · z∈[+71,+216]mm · 9 of 35 slices shown, 12 images]
[im 3/35  brain]
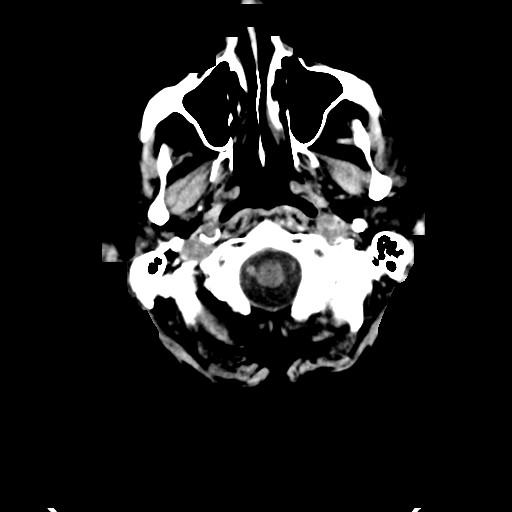
[im 3/35  bone]
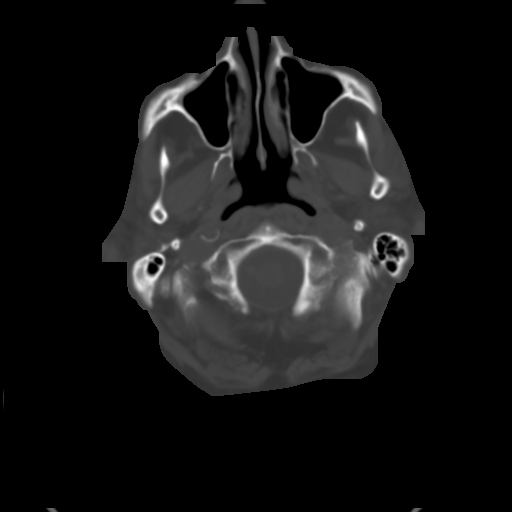
[im 6/35  brain]
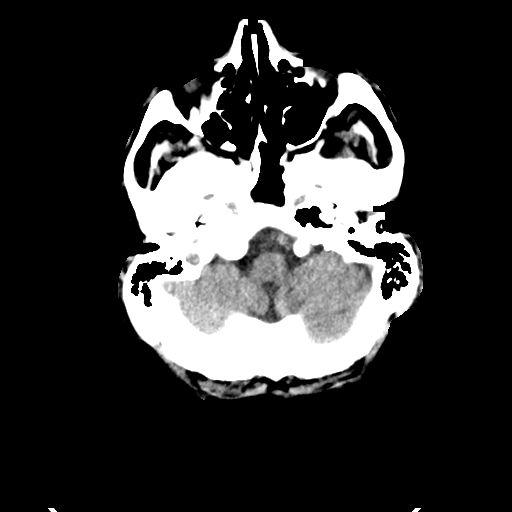
[im 10/35  brain]
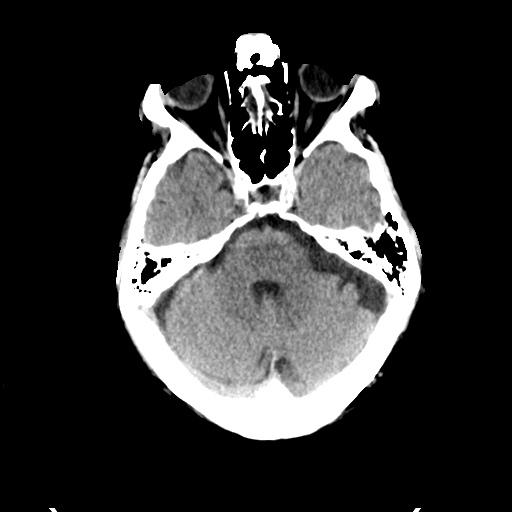
[im 13/35  brain]
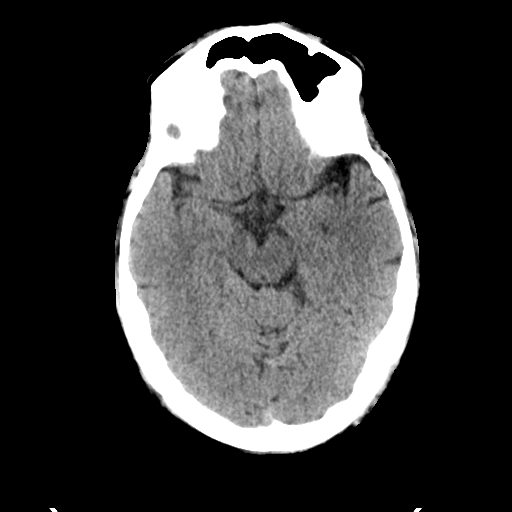
[im 18/35  brain]
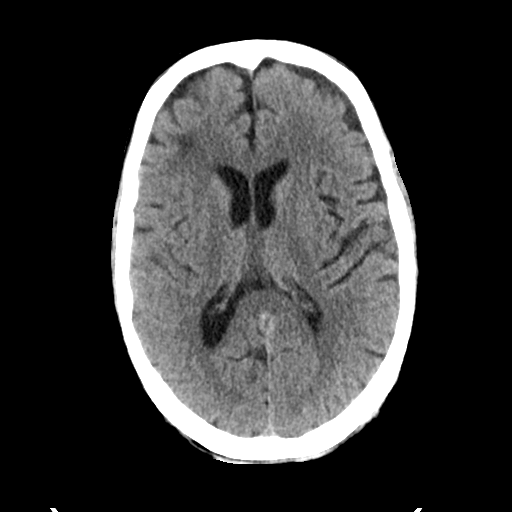
[im 18/35  bone]
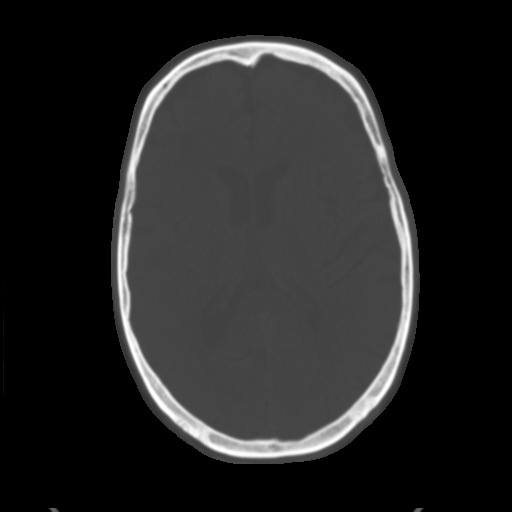
[im 22/35  brain]
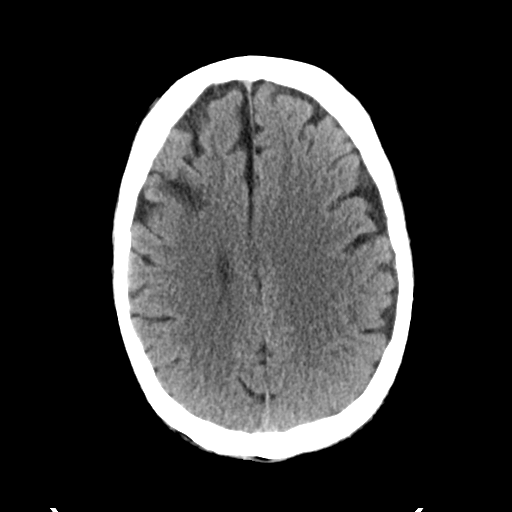
[im 25/35  brain]
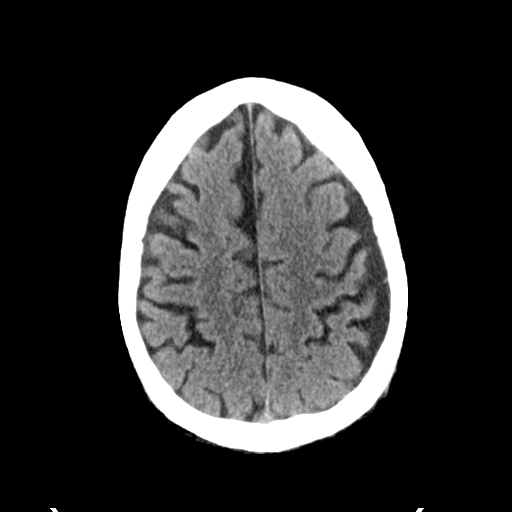
[im 29/35  brain]
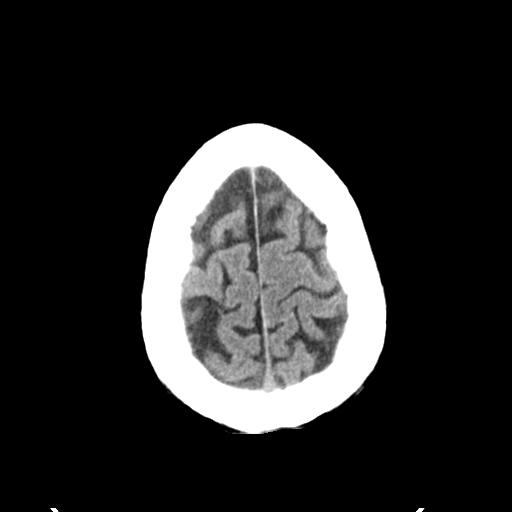
[im 32/35  brain]
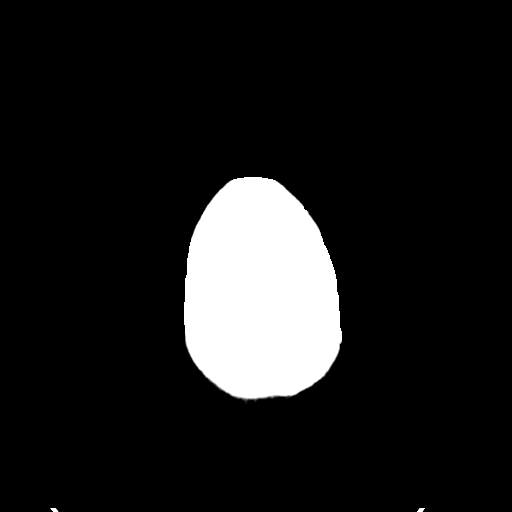
[im 32/35  bone]
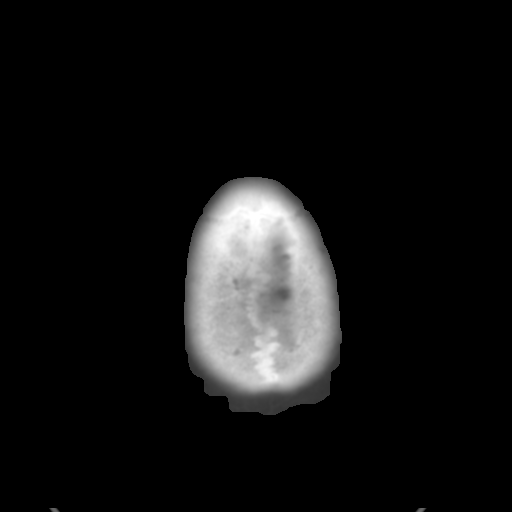

[Series 4: coronal soft tissue · coronal · 0.34mm/px · 3 of 76 slices shown]
[im 26/76  brain]
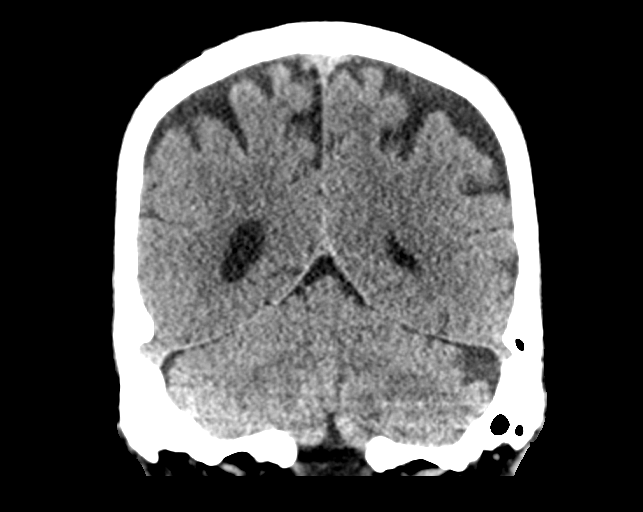
[im 34/76  brain]
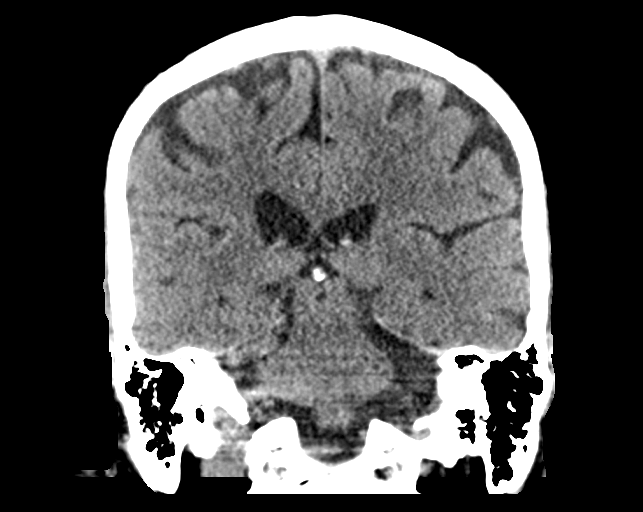
[im 42/76  brain]
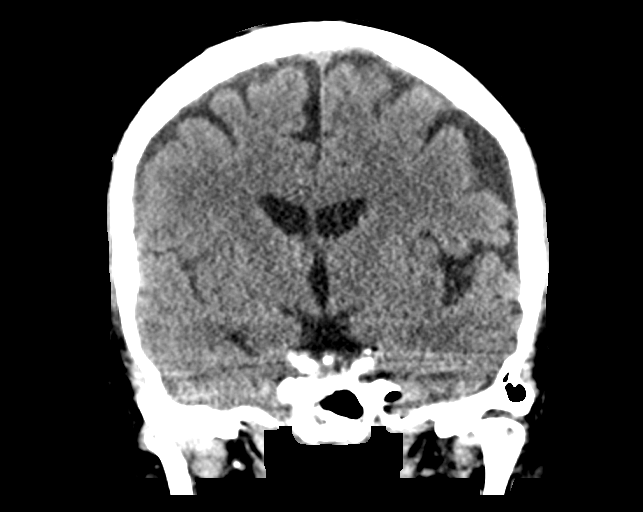

[Series 5: sagittal soft tissue · sagittal · 0.36mm/px · 3 of 58 slices shown]
[im 20/58  brain]
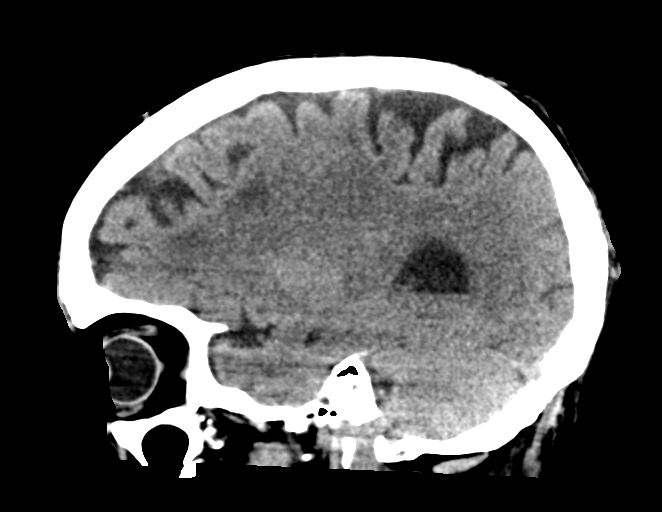
[im 29/58  brain]
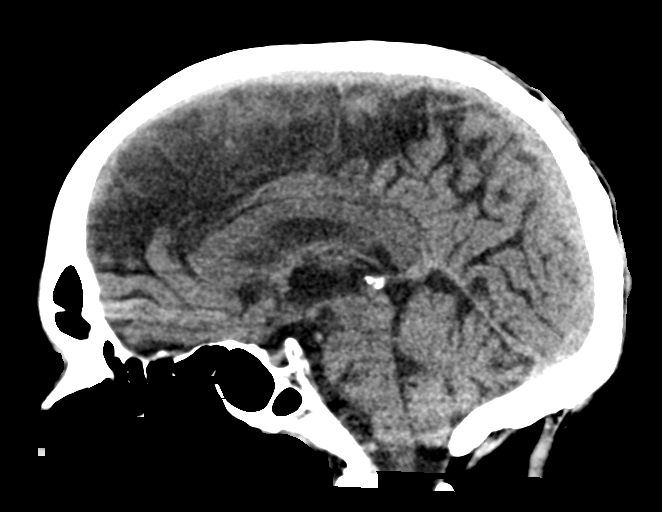
[im 39/58  brain]
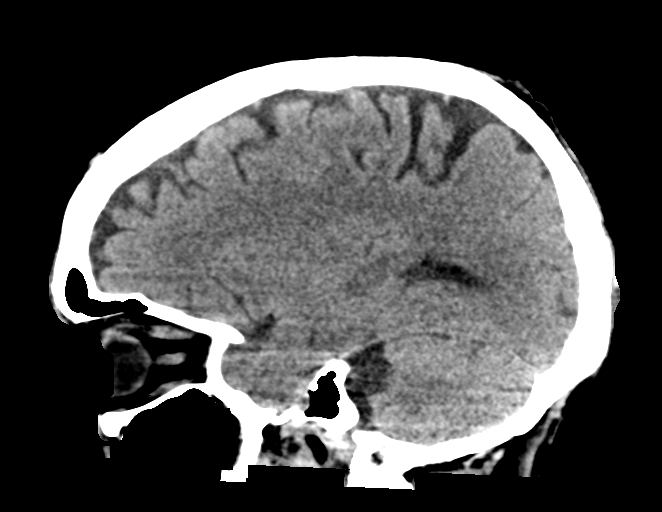

[15 of 47 positions shown; findings below may reference images not displayed]

FINDINGS: Brain: Old right frontal infarcts. Mild parenchymal atrophy. Patchy
areas of hypoattenuation in deep and periventricular white matter
bilaterally. Negative for acute intracranial hemorrhage, mass
lesion, acute infarction, midline shift, or mass-effect. Acute
infarct may be inapparent on noncontrast CT. Ventricles and sulci
symmetric.

Vascular: Atherosclerotic and physiologic intracranial
calcifications.

Skull: Normal. Negative for fracture or focal lesion.

Sinuses/Orbits: No acute finding.

Other: None.
IMPRESSION: 1.   1.  Negative for bleed or other acute intracranial process.
2. Atrophy and nonspecific white matter changes.
3. Old right frontal infarcts.

## 2018-11-02 DIAGNOSIS — Z0000 Encounter for general adult medical examination without abnormal findings: Secondary | ICD-10-CM | POA: Diagnosis not present

## 2018-11-02 DIAGNOSIS — Z6830 Body mass index (BMI) 30.0-30.9, adult: Secondary | ICD-10-CM | POA: Diagnosis not present

## 2018-11-02 DIAGNOSIS — E1122 Type 2 diabetes mellitus with diabetic chronic kidney disease: Secondary | ICD-10-CM | POA: Diagnosis not present

## 2018-11-02 DIAGNOSIS — K210 Gastro-esophageal reflux disease with esophagitis: Secondary | ICD-10-CM | POA: Diagnosis not present

## 2018-11-02 DIAGNOSIS — M545 Low back pain: Secondary | ICD-10-CM | POA: Diagnosis not present

## 2018-11-02 DIAGNOSIS — N182 Chronic kidney disease, stage 2 (mild): Secondary | ICD-10-CM | POA: Diagnosis not present

## 2018-11-02 DIAGNOSIS — I10 Essential (primary) hypertension: Secondary | ICD-10-CM | POA: Diagnosis not present

## 2018-11-02 DIAGNOSIS — Z1389 Encounter for screening for other disorder: Secondary | ICD-10-CM | POA: Diagnosis not present

## 2018-11-08 DIAGNOSIS — M545 Low back pain: Secondary | ICD-10-CM | POA: Diagnosis not present

## 2018-11-08 DIAGNOSIS — K210 Gastro-esophageal reflux disease with esophagitis: Secondary | ICD-10-CM | POA: Diagnosis not present

## 2018-11-08 DIAGNOSIS — I10 Essential (primary) hypertension: Secondary | ICD-10-CM | POA: Diagnosis not present

## 2018-11-08 DIAGNOSIS — E1122 Type 2 diabetes mellitus with diabetic chronic kidney disease: Secondary | ICD-10-CM | POA: Diagnosis not present

## 2018-11-08 DIAGNOSIS — N182 Chronic kidney disease, stage 2 (mild): Secondary | ICD-10-CM | POA: Diagnosis not present

## 2019-01-05 DIAGNOSIS — G8929 Other chronic pain: Secondary | ICD-10-CM | POA: Diagnosis not present

## 2019-01-05 DIAGNOSIS — E785 Hyperlipidemia, unspecified: Secondary | ICD-10-CM | POA: Diagnosis not present

## 2019-01-05 DIAGNOSIS — J449 Chronic obstructive pulmonary disease, unspecified: Secondary | ICD-10-CM | POA: Diagnosis not present

## 2019-01-05 DIAGNOSIS — K219 Gastro-esophageal reflux disease without esophagitis: Secondary | ICD-10-CM | POA: Diagnosis not present

## 2019-01-05 DIAGNOSIS — M1990 Unspecified osteoarthritis, unspecified site: Secondary | ICD-10-CM | POA: Diagnosis not present

## 2019-01-05 DIAGNOSIS — R69 Illness, unspecified: Secondary | ICD-10-CM | POA: Diagnosis not present

## 2019-01-05 DIAGNOSIS — E1151 Type 2 diabetes mellitus with diabetic peripheral angiopathy without gangrene: Secondary | ICD-10-CM | POA: Diagnosis not present

## 2019-01-05 DIAGNOSIS — E1142 Type 2 diabetes mellitus with diabetic polyneuropathy: Secondary | ICD-10-CM | POA: Diagnosis not present

## 2019-01-05 DIAGNOSIS — I10 Essential (primary) hypertension: Secondary | ICD-10-CM | POA: Diagnosis not present

## 2019-01-05 DIAGNOSIS — E669 Obesity, unspecified: Secondary | ICD-10-CM | POA: Diagnosis not present

## 2019-01-11 DIAGNOSIS — K219 Gastro-esophageal reflux disease without esophagitis: Secondary | ICD-10-CM | POA: Diagnosis not present

## 2019-01-11 DIAGNOSIS — I10 Essential (primary) hypertension: Secondary | ICD-10-CM | POA: Diagnosis not present

## 2019-01-11 DIAGNOSIS — M545 Low back pain: Secondary | ICD-10-CM | POA: Diagnosis not present

## 2019-01-11 DIAGNOSIS — E1122 Type 2 diabetes mellitus with diabetic chronic kidney disease: Secondary | ICD-10-CM | POA: Diagnosis not present

## 2019-01-11 DIAGNOSIS — N182 Chronic kidney disease, stage 2 (mild): Secondary | ICD-10-CM | POA: Diagnosis not present

## 2019-01-18 DIAGNOSIS — Z6832 Body mass index (BMI) 32.0-32.9, adult: Secondary | ICD-10-CM | POA: Diagnosis not present

## 2019-01-18 DIAGNOSIS — N182 Chronic kidney disease, stage 2 (mild): Secondary | ICD-10-CM | POA: Diagnosis not present

## 2019-01-18 DIAGNOSIS — M545 Low back pain: Secondary | ICD-10-CM | POA: Diagnosis not present

## 2019-01-18 DIAGNOSIS — Z125 Encounter for screening for malignant neoplasm of prostate: Secondary | ICD-10-CM | POA: Diagnosis not present

## 2019-01-18 DIAGNOSIS — I10 Essential (primary) hypertension: Secondary | ICD-10-CM | POA: Diagnosis not present

## 2019-01-18 DIAGNOSIS — K219 Gastro-esophageal reflux disease without esophagitis: Secondary | ICD-10-CM | POA: Diagnosis not present

## 2019-01-18 DIAGNOSIS — E1122 Type 2 diabetes mellitus with diabetic chronic kidney disease: Secondary | ICD-10-CM | POA: Diagnosis not present

## 2019-02-09 DIAGNOSIS — I10 Essential (primary) hypertension: Secondary | ICD-10-CM | POA: Diagnosis not present

## 2019-02-09 DIAGNOSIS — M545 Low back pain: Secondary | ICD-10-CM | POA: Diagnosis not present

## 2019-02-09 DIAGNOSIS — E1122 Type 2 diabetes mellitus with diabetic chronic kidney disease: Secondary | ICD-10-CM | POA: Diagnosis not present

## 2019-02-09 DIAGNOSIS — N182 Chronic kidney disease, stage 2 (mild): Secondary | ICD-10-CM | POA: Diagnosis not present

## 2019-03-27 DIAGNOSIS — I10 Essential (primary) hypertension: Secondary | ICD-10-CM | POA: Diagnosis not present

## 2019-03-27 DIAGNOSIS — E1122 Type 2 diabetes mellitus with diabetic chronic kidney disease: Secondary | ICD-10-CM | POA: Diagnosis not present

## 2019-03-27 DIAGNOSIS — N182 Chronic kidney disease, stage 2 (mild): Secondary | ICD-10-CM | POA: Diagnosis not present

## 2019-03-27 DIAGNOSIS — M545 Low back pain: Secondary | ICD-10-CM | POA: Diagnosis not present

## 2019-03-28 DIAGNOSIS — N182 Chronic kidney disease, stage 2 (mild): Secondary | ICD-10-CM | POA: Diagnosis not present

## 2019-03-28 DIAGNOSIS — Z6832 Body mass index (BMI) 32.0-32.9, adult: Secondary | ICD-10-CM | POA: Diagnosis not present

## 2019-03-28 DIAGNOSIS — M545 Low back pain: Secondary | ICD-10-CM | POA: Diagnosis not present

## 2019-03-28 DIAGNOSIS — I10 Essential (primary) hypertension: Secondary | ICD-10-CM | POA: Diagnosis not present

## 2019-03-28 DIAGNOSIS — E1122 Type 2 diabetes mellitus with diabetic chronic kidney disease: Secondary | ICD-10-CM | POA: Diagnosis not present

## 2019-03-28 DIAGNOSIS — K2100 Gastro-esophageal reflux disease with esophagitis, without bleeding: Secondary | ICD-10-CM | POA: Diagnosis not present

## 2019-05-22 DIAGNOSIS — N182 Chronic kidney disease, stage 2 (mild): Secondary | ICD-10-CM | POA: Diagnosis not present

## 2019-05-22 DIAGNOSIS — I10 Essential (primary) hypertension: Secondary | ICD-10-CM | POA: Diagnosis not present

## 2019-05-22 DIAGNOSIS — K2100 Gastro-esophageal reflux disease with esophagitis, without bleeding: Secondary | ICD-10-CM | POA: Diagnosis not present

## 2019-05-22 DIAGNOSIS — M545 Low back pain: Secondary | ICD-10-CM | POA: Diagnosis not present

## 2019-05-22 DIAGNOSIS — E1122 Type 2 diabetes mellitus with diabetic chronic kidney disease: Secondary | ICD-10-CM | POA: Diagnosis not present

## 2019-05-23 DIAGNOSIS — M545 Low back pain: Secondary | ICD-10-CM | POA: Diagnosis not present

## 2019-05-23 DIAGNOSIS — E1122 Type 2 diabetes mellitus with diabetic chronic kidney disease: Secondary | ICD-10-CM | POA: Diagnosis not present

## 2019-05-23 DIAGNOSIS — N182 Chronic kidney disease, stage 2 (mild): Secondary | ICD-10-CM | POA: Diagnosis not present

## 2019-05-23 DIAGNOSIS — K2100 Gastro-esophageal reflux disease with esophagitis, without bleeding: Secondary | ICD-10-CM | POA: Diagnosis not present

## 2019-05-23 DIAGNOSIS — I10 Essential (primary) hypertension: Secondary | ICD-10-CM | POA: Diagnosis not present

## 2019-06-12 DIAGNOSIS — E1122 Type 2 diabetes mellitus with diabetic chronic kidney disease: Secondary | ICD-10-CM | POA: Diagnosis not present

## 2019-06-12 DIAGNOSIS — M545 Low back pain: Secondary | ICD-10-CM | POA: Diagnosis not present

## 2019-06-12 DIAGNOSIS — K2100 Gastro-esophageal reflux disease with esophagitis, without bleeding: Secondary | ICD-10-CM | POA: Diagnosis not present

## 2019-06-12 DIAGNOSIS — I10 Essential (primary) hypertension: Secondary | ICD-10-CM | POA: Diagnosis not present

## 2019-06-12 DIAGNOSIS — N182 Chronic kidney disease, stage 2 (mild): Secondary | ICD-10-CM | POA: Diagnosis not present

## 2019-07-06 DIAGNOSIS — Z0000 Encounter for general adult medical examination without abnormal findings: Secondary | ICD-10-CM | POA: Diagnosis not present

## 2019-07-06 DIAGNOSIS — N182 Chronic kidney disease, stage 2 (mild): Secondary | ICD-10-CM | POA: Diagnosis not present

## 2019-07-06 DIAGNOSIS — E1122 Type 2 diabetes mellitus with diabetic chronic kidney disease: Secondary | ICD-10-CM | POA: Diagnosis not present

## 2019-07-06 DIAGNOSIS — K2100 Gastro-esophageal reflux disease with esophagitis, without bleeding: Secondary | ICD-10-CM | POA: Diagnosis not present

## 2019-07-06 DIAGNOSIS — I10 Essential (primary) hypertension: Secondary | ICD-10-CM | POA: Diagnosis not present

## 2019-07-06 DIAGNOSIS — M545 Low back pain: Secondary | ICD-10-CM | POA: Diagnosis not present

## 2019-07-18 DIAGNOSIS — M545 Low back pain: Secondary | ICD-10-CM | POA: Diagnosis not present

## 2019-07-18 DIAGNOSIS — E1122 Type 2 diabetes mellitus with diabetic chronic kidney disease: Secondary | ICD-10-CM | POA: Diagnosis not present

## 2019-07-18 DIAGNOSIS — I10 Essential (primary) hypertension: Secondary | ICD-10-CM | POA: Diagnosis not present

## 2019-07-18 DIAGNOSIS — N182 Chronic kidney disease, stage 2 (mild): Secondary | ICD-10-CM | POA: Diagnosis not present

## 2019-07-18 DIAGNOSIS — K2100 Gastro-esophageal reflux disease with esophagitis, without bleeding: Secondary | ICD-10-CM | POA: Diagnosis not present

## 2019-08-09 DIAGNOSIS — M545 Low back pain: Secondary | ICD-10-CM | POA: Diagnosis not present

## 2019-08-09 DIAGNOSIS — E1122 Type 2 diabetes mellitus with diabetic chronic kidney disease: Secondary | ICD-10-CM | POA: Diagnosis not present

## 2019-08-09 DIAGNOSIS — K2100 Gastro-esophageal reflux disease with esophagitis, without bleeding: Secondary | ICD-10-CM | POA: Diagnosis not present

## 2019-08-09 DIAGNOSIS — N182 Chronic kidney disease, stage 2 (mild): Secondary | ICD-10-CM | POA: Diagnosis not present

## 2019-08-09 DIAGNOSIS — I10 Essential (primary) hypertension: Secondary | ICD-10-CM | POA: Diagnosis not present

## 2019-09-27 DIAGNOSIS — N182 Chronic kidney disease, stage 2 (mild): Secondary | ICD-10-CM | POA: Diagnosis not present

## 2019-09-27 DIAGNOSIS — E1122 Type 2 diabetes mellitus with diabetic chronic kidney disease: Secondary | ICD-10-CM | POA: Diagnosis not present

## 2019-09-27 DIAGNOSIS — M545 Low back pain: Secondary | ICD-10-CM | POA: Diagnosis not present

## 2019-09-27 DIAGNOSIS — I10 Essential (primary) hypertension: Secondary | ICD-10-CM | POA: Diagnosis not present

## 2019-09-27 DIAGNOSIS — K2100 Gastro-esophageal reflux disease with esophagitis, without bleeding: Secondary | ICD-10-CM | POA: Diagnosis not present

## 2019-10-10 DIAGNOSIS — K2100 Gastro-esophageal reflux disease with esophagitis, without bleeding: Secondary | ICD-10-CM | POA: Diagnosis not present

## 2019-10-10 DIAGNOSIS — N182 Chronic kidney disease, stage 2 (mild): Secondary | ICD-10-CM | POA: Diagnosis not present

## 2019-10-10 DIAGNOSIS — M545 Low back pain: Secondary | ICD-10-CM | POA: Diagnosis not present

## 2019-10-10 DIAGNOSIS — I10 Essential (primary) hypertension: Secondary | ICD-10-CM | POA: Diagnosis not present

## 2019-10-10 DIAGNOSIS — E1122 Type 2 diabetes mellitus with diabetic chronic kidney disease: Secondary | ICD-10-CM | POA: Diagnosis not present

## 2019-10-10 DIAGNOSIS — Z0000 Encounter for general adult medical examination without abnormal findings: Secondary | ICD-10-CM | POA: Diagnosis not present

## 2019-10-31 DIAGNOSIS — D3502 Benign neoplasm of left adrenal gland: Secondary | ICD-10-CM | POA: Diagnosis not present

## 2019-10-31 DIAGNOSIS — N182 Chronic kidney disease, stage 2 (mild): Secondary | ICD-10-CM | POA: Diagnosis not present

## 2019-10-31 DIAGNOSIS — I2510 Atherosclerotic heart disease of native coronary artery without angina pectoris: Secondary | ICD-10-CM | POA: Diagnosis not present

## 2019-10-31 DIAGNOSIS — F17210 Nicotine dependence, cigarettes, uncomplicated: Secondary | ICD-10-CM | POA: Diagnosis not present

## 2019-10-31 DIAGNOSIS — I700 Atherosclerosis of aorta: Secondary | ICD-10-CM | POA: Diagnosis not present

## 2019-10-31 DIAGNOSIS — I10 Essential (primary) hypertension: Secondary | ICD-10-CM | POA: Diagnosis not present

## 2019-10-31 DIAGNOSIS — M545 Low back pain: Secondary | ICD-10-CM | POA: Diagnosis not present

## 2019-10-31 DIAGNOSIS — K2100 Gastro-esophageal reflux disease with esophagitis, without bleeding: Secondary | ICD-10-CM | POA: Diagnosis not present

## 2019-10-31 DIAGNOSIS — E1122 Type 2 diabetes mellitus with diabetic chronic kidney disease: Secondary | ICD-10-CM | POA: Diagnosis not present

## 2019-11-28 DIAGNOSIS — M545 Low back pain: Secondary | ICD-10-CM | POA: Diagnosis not present

## 2019-11-28 DIAGNOSIS — E1122 Type 2 diabetes mellitus with diabetic chronic kidney disease: Secondary | ICD-10-CM | POA: Diagnosis not present

## 2019-11-28 DIAGNOSIS — N182 Chronic kidney disease, stage 2 (mild): Secondary | ICD-10-CM | POA: Diagnosis not present

## 2019-11-28 DIAGNOSIS — K2100 Gastro-esophageal reflux disease with esophagitis, without bleeding: Secondary | ICD-10-CM | POA: Diagnosis not present

## 2019-11-28 DIAGNOSIS — I10 Essential (primary) hypertension: Secondary | ICD-10-CM | POA: Diagnosis not present

## 2019-12-14 DIAGNOSIS — M545 Low back pain, unspecified: Secondary | ICD-10-CM | POA: Diagnosis not present

## 2019-12-14 DIAGNOSIS — K2100 Gastro-esophageal reflux disease with esophagitis, without bleeding: Secondary | ICD-10-CM | POA: Diagnosis not present

## 2019-12-14 DIAGNOSIS — N182 Chronic kidney disease, stage 2 (mild): Secondary | ICD-10-CM | POA: Diagnosis not present

## 2019-12-14 DIAGNOSIS — I10 Essential (primary) hypertension: Secondary | ICD-10-CM | POA: Diagnosis not present

## 2019-12-14 DIAGNOSIS — E1122 Type 2 diabetes mellitus with diabetic chronic kidney disease: Secondary | ICD-10-CM | POA: Diagnosis not present

## 2019-12-20 DIAGNOSIS — I10 Essential (primary) hypertension: Secondary | ICD-10-CM | POA: Diagnosis not present

## 2019-12-20 DIAGNOSIS — Z0000 Encounter for general adult medical examination without abnormal findings: Secondary | ICD-10-CM | POA: Diagnosis not present

## 2019-12-20 DIAGNOSIS — I700 Atherosclerosis of aorta: Secondary | ICD-10-CM | POA: Diagnosis not present

## 2019-12-20 DIAGNOSIS — I2510 Atherosclerotic heart disease of native coronary artery without angina pectoris: Secondary | ICD-10-CM | POA: Diagnosis not present

## 2019-12-20 DIAGNOSIS — K2100 Gastro-esophageal reflux disease with esophagitis, without bleeding: Secondary | ICD-10-CM | POA: Diagnosis not present

## 2019-12-20 DIAGNOSIS — E1122 Type 2 diabetes mellitus with diabetic chronic kidney disease: Secondary | ICD-10-CM | POA: Diagnosis not present

## 2020-01-15 DIAGNOSIS — I10 Essential (primary) hypertension: Secondary | ICD-10-CM | POA: Diagnosis not present

## 2020-01-15 DIAGNOSIS — N182 Chronic kidney disease, stage 2 (mild): Secondary | ICD-10-CM | POA: Diagnosis not present

## 2020-01-15 DIAGNOSIS — E1122 Type 2 diabetes mellitus with diabetic chronic kidney disease: Secondary | ICD-10-CM | POA: Diagnosis not present

## 2020-01-15 DIAGNOSIS — M545 Low back pain, unspecified: Secondary | ICD-10-CM | POA: Diagnosis not present

## 2020-02-22 DIAGNOSIS — I10 Essential (primary) hypertension: Secondary | ICD-10-CM | POA: Diagnosis not present

## 2020-02-22 DIAGNOSIS — E1122 Type 2 diabetes mellitus with diabetic chronic kidney disease: Secondary | ICD-10-CM | POA: Diagnosis not present

## 2020-02-22 DIAGNOSIS — M545 Low back pain, unspecified: Secondary | ICD-10-CM | POA: Diagnosis not present

## 2020-02-22 DIAGNOSIS — N182 Chronic kidney disease, stage 2 (mild): Secondary | ICD-10-CM | POA: Diagnosis not present

## 2020-03-01 DIAGNOSIS — E1122 Type 2 diabetes mellitus with diabetic chronic kidney disease: Secondary | ICD-10-CM | POA: Diagnosis not present

## 2020-03-01 DIAGNOSIS — K2100 Gastro-esophageal reflux disease with esophagitis, without bleeding: Secondary | ICD-10-CM | POA: Diagnosis not present

## 2020-03-01 DIAGNOSIS — N182 Chronic kidney disease, stage 2 (mild): Secondary | ICD-10-CM | POA: Diagnosis not present

## 2020-03-01 DIAGNOSIS — I10 Essential (primary) hypertension: Secondary | ICD-10-CM | POA: Diagnosis not present

## 2020-03-01 DIAGNOSIS — I700 Atherosclerosis of aorta: Secondary | ICD-10-CM | POA: Diagnosis not present

## 2020-03-30 DIAGNOSIS — E1122 Type 2 diabetes mellitus with diabetic chronic kidney disease: Secondary | ICD-10-CM | POA: Diagnosis not present

## 2020-03-30 DIAGNOSIS — N182 Chronic kidney disease, stage 2 (mild): Secondary | ICD-10-CM | POA: Diagnosis not present

## 2020-03-30 DIAGNOSIS — I10 Essential (primary) hypertension: Secondary | ICD-10-CM | POA: Diagnosis not present

## 2020-03-30 DIAGNOSIS — I129 Hypertensive chronic kidney disease with stage 1 through stage 4 chronic kidney disease, or unspecified chronic kidney disease: Secondary | ICD-10-CM | POA: Diagnosis not present

## 2020-03-30 DIAGNOSIS — M545 Low back pain, unspecified: Secondary | ICD-10-CM | POA: Diagnosis not present

## 2020-04-11 DIAGNOSIS — E1140 Type 2 diabetes mellitus with diabetic neuropathy, unspecified: Secondary | ICD-10-CM | POA: Diagnosis not present

## 2020-04-11 DIAGNOSIS — R0689 Other abnormalities of breathing: Secondary | ICD-10-CM | POA: Diagnosis not present

## 2020-04-11 DIAGNOSIS — Z043 Encounter for examination and observation following other accident: Secondary | ICD-10-CM | POA: Diagnosis not present

## 2020-04-11 DIAGNOSIS — S0990XA Unspecified injury of head, initial encounter: Secondary | ICD-10-CM | POA: Diagnosis not present

## 2020-04-11 DIAGNOSIS — N3000 Acute cystitis without hematuria: Secondary | ICD-10-CM | POA: Diagnosis not present

## 2020-04-11 DIAGNOSIS — Z1623 Resistance to quinolones and fluoroquinolones: Secondary | ICD-10-CM | POA: Diagnosis not present

## 2020-04-11 DIAGNOSIS — J440 Chronic obstructive pulmonary disease with acute lower respiratory infection: Secondary | ICD-10-CM | POA: Diagnosis not present

## 2020-04-11 DIAGNOSIS — Z7984 Long term (current) use of oral hypoglycemic drugs: Secondary | ICD-10-CM | POA: Diagnosis not present

## 2020-04-11 DIAGNOSIS — I10 Essential (primary) hypertension: Secondary | ICD-10-CM | POA: Diagnosis not present

## 2020-04-11 DIAGNOSIS — E785 Hyperlipidemia, unspecified: Secondary | ICD-10-CM | POA: Diagnosis not present

## 2020-04-11 DIAGNOSIS — I951 Orthostatic hypotension: Secondary | ICD-10-CM | POA: Diagnosis not present

## 2020-04-11 DIAGNOSIS — J90 Pleural effusion, not elsewhere classified: Secondary | ICD-10-CM | POA: Diagnosis not present

## 2020-04-11 DIAGNOSIS — F17210 Nicotine dependence, cigarettes, uncomplicated: Secondary | ICD-10-CM | POA: Diagnosis not present

## 2020-04-11 DIAGNOSIS — Z7901 Long term (current) use of anticoagulants: Secondary | ICD-10-CM | POA: Diagnosis not present

## 2020-04-11 DIAGNOSIS — R5381 Other malaise: Secondary | ICD-10-CM | POA: Diagnosis not present

## 2020-04-11 DIAGNOSIS — R6520 Severe sepsis without septic shock: Secondary | ICD-10-CM | POA: Diagnosis not present

## 2020-04-11 DIAGNOSIS — R404 Transient alteration of awareness: Secondary | ICD-10-CM | POA: Diagnosis not present

## 2020-04-11 DIAGNOSIS — J1282 Pneumonia due to coronavirus disease 2019: Secondary | ICD-10-CM | POA: Diagnosis not present

## 2020-04-11 DIAGNOSIS — E872 Acidosis: Secondary | ICD-10-CM | POA: Diagnosis not present

## 2020-04-11 DIAGNOSIS — M549 Dorsalgia, unspecified: Secondary | ICD-10-CM | POA: Diagnosis not present

## 2020-04-11 DIAGNOSIS — E7800 Pure hypercholesterolemia, unspecified: Secondary | ICD-10-CM | POA: Diagnosis not present

## 2020-04-11 DIAGNOSIS — N179 Acute kidney failure, unspecified: Secondary | ICD-10-CM | POA: Diagnosis not present

## 2020-04-11 DIAGNOSIS — Z743 Need for continuous supervision: Secondary | ICD-10-CM | POA: Diagnosis not present

## 2020-04-11 DIAGNOSIS — N390 Urinary tract infection, site not specified: Secondary | ICD-10-CM | POA: Diagnosis not present

## 2020-04-11 DIAGNOSIS — B9689 Other specified bacterial agents as the cause of diseases classified elsewhere: Secondary | ICD-10-CM | POA: Diagnosis not present

## 2020-04-11 DIAGNOSIS — A4189 Other specified sepsis: Secondary | ICD-10-CM | POA: Diagnosis not present

## 2020-04-11 DIAGNOSIS — A419 Sepsis, unspecified organism: Secondary | ICD-10-CM | POA: Diagnosis not present

## 2020-04-11 DIAGNOSIS — N289 Disorder of kidney and ureter, unspecified: Secondary | ICD-10-CM | POA: Diagnosis not present

## 2020-04-11 DIAGNOSIS — I499 Cardiac arrhythmia, unspecified: Secondary | ICD-10-CM | POA: Diagnosis not present

## 2020-04-11 DIAGNOSIS — E860 Dehydration: Secondary | ICD-10-CM | POA: Diagnosis not present

## 2020-04-11 DIAGNOSIS — Z1624 Resistance to multiple antibiotics: Secondary | ICD-10-CM | POA: Diagnosis not present

## 2020-04-11 DIAGNOSIS — W19XXXA Unspecified fall, initial encounter: Secondary | ICD-10-CM | POA: Diagnosis not present

## 2020-04-11 DIAGNOSIS — Z79899 Other long term (current) drug therapy: Secondary | ICD-10-CM | POA: Diagnosis not present

## 2020-04-11 DIAGNOSIS — E875 Hyperkalemia: Secondary | ICD-10-CM | POA: Diagnosis not present

## 2020-04-11 DIAGNOSIS — N471 Phimosis: Secondary | ICD-10-CM | POA: Diagnosis not present

## 2020-04-11 DIAGNOSIS — R55 Syncope and collapse: Secondary | ICD-10-CM | POA: Diagnosis not present

## 2020-04-11 DIAGNOSIS — R531 Weakness: Secondary | ICD-10-CM | POA: Diagnosis not present

## 2020-04-11 DIAGNOSIS — G9341 Metabolic encephalopathy: Secondary | ICD-10-CM | POA: Diagnosis not present

## 2020-04-11 DIAGNOSIS — J449 Chronic obstructive pulmonary disease, unspecified: Secondary | ICD-10-CM | POA: Diagnosis not present

## 2020-04-11 DIAGNOSIS — U071 COVID-19: Secondary | ICD-10-CM | POA: Diagnosis not present

## 2020-04-26 DIAGNOSIS — R0600 Dyspnea, unspecified: Secondary | ICD-10-CM | POA: Diagnosis not present

## 2020-04-26 DIAGNOSIS — U071 COVID-19: Secondary | ICD-10-CM | POA: Diagnosis not present

## 2020-04-26 DIAGNOSIS — J449 Chronic obstructive pulmonary disease, unspecified: Secondary | ICD-10-CM | POA: Diagnosis not present

## 2020-04-26 DIAGNOSIS — N3000 Acute cystitis without hematuria: Secondary | ICD-10-CM | POA: Diagnosis not present

## 2020-04-26 DIAGNOSIS — E785 Hyperlipidemia, unspecified: Secondary | ICD-10-CM | POA: Diagnosis not present

## 2020-04-26 DIAGNOSIS — M545 Low back pain, unspecified: Secondary | ICD-10-CM | POA: Diagnosis not present

## 2020-04-26 DIAGNOSIS — I10 Essential (primary) hypertension: Secondary | ICD-10-CM | POA: Diagnosis not present

## 2020-04-26 DIAGNOSIS — K449 Diaphragmatic hernia without obstruction or gangrene: Secondary | ICD-10-CM | POA: Diagnosis not present

## 2020-04-26 DIAGNOSIS — G43909 Migraine, unspecified, not intractable, without status migrainosus: Secondary | ICD-10-CM | POA: Diagnosis not present

## 2020-04-26 DIAGNOSIS — Z79891 Long term (current) use of opiate analgesic: Secondary | ICD-10-CM | POA: Diagnosis not present

## 2020-04-26 DIAGNOSIS — Z7984 Long term (current) use of oral hypoglycemic drugs: Secondary | ICD-10-CM | POA: Diagnosis not present

## 2020-04-26 DIAGNOSIS — E1140 Type 2 diabetes mellitus with diabetic neuropathy, unspecified: Secondary | ICD-10-CM | POA: Diagnosis not present

## 2020-04-30 DIAGNOSIS — G43909 Migraine, unspecified, not intractable, without status migrainosus: Secondary | ICD-10-CM | POA: Diagnosis not present

## 2020-04-30 DIAGNOSIS — M545 Low back pain, unspecified: Secondary | ICD-10-CM | POA: Diagnosis not present

## 2020-04-30 DIAGNOSIS — J449 Chronic obstructive pulmonary disease, unspecified: Secondary | ICD-10-CM | POA: Diagnosis not present

## 2020-04-30 DIAGNOSIS — R0600 Dyspnea, unspecified: Secondary | ICD-10-CM | POA: Diagnosis not present

## 2020-04-30 DIAGNOSIS — E785 Hyperlipidemia, unspecified: Secondary | ICD-10-CM | POA: Diagnosis not present

## 2020-04-30 DIAGNOSIS — Z7984 Long term (current) use of oral hypoglycemic drugs: Secondary | ICD-10-CM | POA: Diagnosis not present

## 2020-04-30 DIAGNOSIS — K449 Diaphragmatic hernia without obstruction or gangrene: Secondary | ICD-10-CM | POA: Diagnosis not present

## 2020-04-30 DIAGNOSIS — U071 COVID-19: Secondary | ICD-10-CM | POA: Diagnosis not present

## 2020-04-30 DIAGNOSIS — I10 Essential (primary) hypertension: Secondary | ICD-10-CM | POA: Diagnosis not present

## 2020-04-30 DIAGNOSIS — E1140 Type 2 diabetes mellitus with diabetic neuropathy, unspecified: Secondary | ICD-10-CM | POA: Diagnosis not present

## 2020-04-30 DIAGNOSIS — N3000 Acute cystitis without hematuria: Secondary | ICD-10-CM | POA: Diagnosis not present

## 2020-04-30 DIAGNOSIS — Z79891 Long term (current) use of opiate analgesic: Secondary | ICD-10-CM | POA: Diagnosis not present

## 2020-05-03 DIAGNOSIS — R0600 Dyspnea, unspecified: Secondary | ICD-10-CM | POA: Diagnosis not present

## 2020-05-03 DIAGNOSIS — K449 Diaphragmatic hernia without obstruction or gangrene: Secondary | ICD-10-CM | POA: Diagnosis not present

## 2020-05-03 DIAGNOSIS — Z79891 Long term (current) use of opiate analgesic: Secondary | ICD-10-CM | POA: Diagnosis not present

## 2020-05-03 DIAGNOSIS — Z7984 Long term (current) use of oral hypoglycemic drugs: Secondary | ICD-10-CM | POA: Diagnosis not present

## 2020-05-03 DIAGNOSIS — U071 COVID-19: Secondary | ICD-10-CM | POA: Diagnosis not present

## 2020-05-03 DIAGNOSIS — E1140 Type 2 diabetes mellitus with diabetic neuropathy, unspecified: Secondary | ICD-10-CM | POA: Diagnosis not present

## 2020-05-03 DIAGNOSIS — N3000 Acute cystitis without hematuria: Secondary | ICD-10-CM | POA: Diagnosis not present

## 2020-05-03 DIAGNOSIS — J449 Chronic obstructive pulmonary disease, unspecified: Secondary | ICD-10-CM | POA: Diagnosis not present

## 2020-05-03 DIAGNOSIS — G43909 Migraine, unspecified, not intractable, without status migrainosus: Secondary | ICD-10-CM | POA: Diagnosis not present

## 2020-05-03 DIAGNOSIS — M545 Low back pain, unspecified: Secondary | ICD-10-CM | POA: Diagnosis not present

## 2020-05-03 DIAGNOSIS — E785 Hyperlipidemia, unspecified: Secondary | ICD-10-CM | POA: Diagnosis not present

## 2020-05-03 DIAGNOSIS — I10 Essential (primary) hypertension: Secondary | ICD-10-CM | POA: Diagnosis not present

## 2020-05-06 DIAGNOSIS — J449 Chronic obstructive pulmonary disease, unspecified: Secondary | ICD-10-CM | POA: Diagnosis not present

## 2020-05-06 DIAGNOSIS — N3000 Acute cystitis without hematuria: Secondary | ICD-10-CM | POA: Diagnosis not present

## 2020-05-06 DIAGNOSIS — Z79891 Long term (current) use of opiate analgesic: Secondary | ICD-10-CM | POA: Diagnosis not present

## 2020-05-06 DIAGNOSIS — K449 Diaphragmatic hernia without obstruction or gangrene: Secondary | ICD-10-CM | POA: Diagnosis not present

## 2020-05-06 DIAGNOSIS — E1140 Type 2 diabetes mellitus with diabetic neuropathy, unspecified: Secondary | ICD-10-CM | POA: Diagnosis not present

## 2020-05-06 DIAGNOSIS — G43909 Migraine, unspecified, not intractable, without status migrainosus: Secondary | ICD-10-CM | POA: Diagnosis not present

## 2020-05-06 DIAGNOSIS — E785 Hyperlipidemia, unspecified: Secondary | ICD-10-CM | POA: Diagnosis not present

## 2020-05-06 DIAGNOSIS — U071 COVID-19: Secondary | ICD-10-CM | POA: Diagnosis not present

## 2020-05-06 DIAGNOSIS — M545 Low back pain, unspecified: Secondary | ICD-10-CM | POA: Diagnosis not present

## 2020-05-06 DIAGNOSIS — I10 Essential (primary) hypertension: Secondary | ICD-10-CM | POA: Diagnosis not present

## 2020-05-06 DIAGNOSIS — R0600 Dyspnea, unspecified: Secondary | ICD-10-CM | POA: Diagnosis not present

## 2020-05-06 DIAGNOSIS — Z7984 Long term (current) use of oral hypoglycemic drugs: Secondary | ICD-10-CM | POA: Diagnosis not present

## 2020-05-07 DIAGNOSIS — U071 COVID-19: Secondary | ICD-10-CM | POA: Diagnosis not present

## 2020-05-10 DIAGNOSIS — J449 Chronic obstructive pulmonary disease, unspecified: Secondary | ICD-10-CM | POA: Diagnosis not present

## 2020-05-10 DIAGNOSIS — G43909 Migraine, unspecified, not intractable, without status migrainosus: Secondary | ICD-10-CM | POA: Diagnosis not present

## 2020-05-10 DIAGNOSIS — N3000 Acute cystitis without hematuria: Secondary | ICD-10-CM | POA: Diagnosis not present

## 2020-05-10 DIAGNOSIS — R0600 Dyspnea, unspecified: Secondary | ICD-10-CM | POA: Diagnosis not present

## 2020-05-10 DIAGNOSIS — U071 COVID-19: Secondary | ICD-10-CM | POA: Diagnosis not present

## 2020-05-10 DIAGNOSIS — M545 Low back pain, unspecified: Secondary | ICD-10-CM | POA: Diagnosis not present

## 2020-05-10 DIAGNOSIS — K449 Diaphragmatic hernia without obstruction or gangrene: Secondary | ICD-10-CM | POA: Diagnosis not present

## 2020-05-10 DIAGNOSIS — E1140 Type 2 diabetes mellitus with diabetic neuropathy, unspecified: Secondary | ICD-10-CM | POA: Diagnosis not present

## 2020-05-10 DIAGNOSIS — Z79891 Long term (current) use of opiate analgesic: Secondary | ICD-10-CM | POA: Diagnosis not present

## 2020-05-10 DIAGNOSIS — I10 Essential (primary) hypertension: Secondary | ICD-10-CM | POA: Diagnosis not present

## 2020-05-10 DIAGNOSIS — E785 Hyperlipidemia, unspecified: Secondary | ICD-10-CM | POA: Diagnosis not present

## 2020-05-10 DIAGNOSIS — Z7984 Long term (current) use of oral hypoglycemic drugs: Secondary | ICD-10-CM | POA: Diagnosis not present

## 2020-05-15 DIAGNOSIS — G43909 Migraine, unspecified, not intractable, without status migrainosus: Secondary | ICD-10-CM | POA: Diagnosis not present

## 2020-05-15 DIAGNOSIS — R0600 Dyspnea, unspecified: Secondary | ICD-10-CM | POA: Diagnosis not present

## 2020-05-15 DIAGNOSIS — K449 Diaphragmatic hernia without obstruction or gangrene: Secondary | ICD-10-CM | POA: Diagnosis not present

## 2020-05-15 DIAGNOSIS — M545 Low back pain, unspecified: Secondary | ICD-10-CM | POA: Diagnosis not present

## 2020-05-15 DIAGNOSIS — J449 Chronic obstructive pulmonary disease, unspecified: Secondary | ICD-10-CM | POA: Diagnosis not present

## 2020-05-15 DIAGNOSIS — E785 Hyperlipidemia, unspecified: Secondary | ICD-10-CM | POA: Diagnosis not present

## 2020-05-15 DIAGNOSIS — U071 COVID-19: Secondary | ICD-10-CM | POA: Diagnosis not present

## 2020-05-15 DIAGNOSIS — Z7984 Long term (current) use of oral hypoglycemic drugs: Secondary | ICD-10-CM | POA: Diagnosis not present

## 2020-05-15 DIAGNOSIS — N3000 Acute cystitis without hematuria: Secondary | ICD-10-CM | POA: Diagnosis not present

## 2020-05-15 DIAGNOSIS — Z79891 Long term (current) use of opiate analgesic: Secondary | ICD-10-CM | POA: Diagnosis not present

## 2020-05-15 DIAGNOSIS — I10 Essential (primary) hypertension: Secondary | ICD-10-CM | POA: Diagnosis not present

## 2020-05-15 DIAGNOSIS — E1140 Type 2 diabetes mellitus with diabetic neuropathy, unspecified: Secondary | ICD-10-CM | POA: Diagnosis not present

## 2020-05-23 DIAGNOSIS — E1140 Type 2 diabetes mellitus with diabetic neuropathy, unspecified: Secondary | ICD-10-CM | POA: Diagnosis not present

## 2020-05-23 DIAGNOSIS — R0600 Dyspnea, unspecified: Secondary | ICD-10-CM | POA: Diagnosis not present

## 2020-05-23 DIAGNOSIS — U071 COVID-19: Secondary | ICD-10-CM | POA: Diagnosis not present

## 2020-05-23 DIAGNOSIS — G43909 Migraine, unspecified, not intractable, without status migrainosus: Secondary | ICD-10-CM | POA: Diagnosis not present

## 2020-05-23 DIAGNOSIS — N3000 Acute cystitis without hematuria: Secondary | ICD-10-CM | POA: Diagnosis not present

## 2020-05-23 DIAGNOSIS — K449 Diaphragmatic hernia without obstruction or gangrene: Secondary | ICD-10-CM | POA: Diagnosis not present

## 2020-05-23 DIAGNOSIS — Z7984 Long term (current) use of oral hypoglycemic drugs: Secondary | ICD-10-CM | POA: Diagnosis not present

## 2020-05-23 DIAGNOSIS — M545 Low back pain, unspecified: Secondary | ICD-10-CM | POA: Diagnosis not present

## 2020-05-23 DIAGNOSIS — J449 Chronic obstructive pulmonary disease, unspecified: Secondary | ICD-10-CM | POA: Diagnosis not present

## 2020-05-23 DIAGNOSIS — I10 Essential (primary) hypertension: Secondary | ICD-10-CM | POA: Diagnosis not present

## 2020-05-23 DIAGNOSIS — Z79891 Long term (current) use of opiate analgesic: Secondary | ICD-10-CM | POA: Diagnosis not present

## 2020-05-23 DIAGNOSIS — E785 Hyperlipidemia, unspecified: Secondary | ICD-10-CM | POA: Diagnosis not present

## 2020-05-29 DIAGNOSIS — E1122 Type 2 diabetes mellitus with diabetic chronic kidney disease: Secondary | ICD-10-CM | POA: Diagnosis not present

## 2020-05-29 DIAGNOSIS — I10 Essential (primary) hypertension: Secondary | ICD-10-CM | POA: Diagnosis not present

## 2020-05-31 DIAGNOSIS — R55 Syncope and collapse: Secondary | ICD-10-CM | POA: Diagnosis not present

## 2020-05-31 DIAGNOSIS — E872 Acidosis: Secondary | ICD-10-CM | POA: Diagnosis not present

## 2020-05-31 DIAGNOSIS — E860 Dehydration: Secondary | ICD-10-CM | POA: Diagnosis not present

## 2020-05-31 DIAGNOSIS — I951 Orthostatic hypotension: Secondary | ICD-10-CM | POA: Diagnosis not present

## 2020-05-31 DIAGNOSIS — U071 COVID-19: Secondary | ICD-10-CM | POA: Diagnosis not present

## 2020-06-29 DIAGNOSIS — E1122 Type 2 diabetes mellitus with diabetic chronic kidney disease: Secondary | ICD-10-CM | POA: Diagnosis not present

## 2020-06-29 DIAGNOSIS — I10 Essential (primary) hypertension: Secondary | ICD-10-CM | POA: Diagnosis not present

## 2020-07-08 DIAGNOSIS — K2100 Gastro-esophageal reflux disease with esophagitis, without bleeding: Secondary | ICD-10-CM | POA: Diagnosis not present

## 2020-07-08 DIAGNOSIS — Z0000 Encounter for general adult medical examination without abnormal findings: Secondary | ICD-10-CM | POA: Diagnosis not present

## 2020-07-08 DIAGNOSIS — E1122 Type 2 diabetes mellitus with diabetic chronic kidney disease: Secondary | ICD-10-CM | POA: Diagnosis not present

## 2020-07-08 DIAGNOSIS — K5900 Constipation, unspecified: Secondary | ICD-10-CM | POA: Diagnosis not present

## 2020-07-08 DIAGNOSIS — K2970 Gastritis, unspecified, without bleeding: Secondary | ICD-10-CM | POA: Diagnosis not present

## 2020-07-10 ENCOUNTER — Encounter (INDEPENDENT_AMBULATORY_CARE_PROVIDER_SITE_OTHER): Payer: Self-pay | Admitting: *Deleted

## 2020-07-22 DIAGNOSIS — E860 Dehydration: Secondary | ICD-10-CM | POA: Diagnosis not present

## 2020-07-22 DIAGNOSIS — R55 Syncope and collapse: Secondary | ICD-10-CM | POA: Diagnosis not present

## 2020-07-22 DIAGNOSIS — I951 Orthostatic hypotension: Secondary | ICD-10-CM | POA: Diagnosis not present

## 2020-07-22 DIAGNOSIS — U071 COVID-19: Secondary | ICD-10-CM | POA: Diagnosis not present

## 2020-07-22 DIAGNOSIS — E872 Acidosis: Secondary | ICD-10-CM | POA: Diagnosis not present

## 2020-08-22 DIAGNOSIS — E872 Acidosis: Secondary | ICD-10-CM | POA: Diagnosis not present

## 2020-08-22 DIAGNOSIS — E860 Dehydration: Secondary | ICD-10-CM | POA: Diagnosis not present

## 2020-08-22 DIAGNOSIS — U071 COVID-19: Secondary | ICD-10-CM | POA: Diagnosis not present

## 2020-08-22 DIAGNOSIS — R55 Syncope and collapse: Secondary | ICD-10-CM | POA: Diagnosis not present

## 2020-08-22 DIAGNOSIS — I951 Orthostatic hypotension: Secondary | ICD-10-CM | POA: Diagnosis not present

## 2020-08-27 DIAGNOSIS — I700 Atherosclerosis of aorta: Secondary | ICD-10-CM | POA: Diagnosis not present

## 2020-08-27 DIAGNOSIS — Z0000 Encounter for general adult medical examination without abnormal findings: Secondary | ICD-10-CM | POA: Diagnosis not present

## 2020-08-27 DIAGNOSIS — M545 Low back pain, unspecified: Secondary | ICD-10-CM | POA: Diagnosis not present

## 2020-08-27 DIAGNOSIS — E1122 Type 2 diabetes mellitus with diabetic chronic kidney disease: Secondary | ICD-10-CM | POA: Diagnosis not present

## 2020-08-27 DIAGNOSIS — K5900 Constipation, unspecified: Secondary | ICD-10-CM | POA: Diagnosis not present

## 2020-08-27 DIAGNOSIS — K2100 Gastro-esophageal reflux disease with esophagitis, without bleeding: Secondary | ICD-10-CM | POA: Diagnosis not present

## 2020-08-29 DIAGNOSIS — I10 Essential (primary) hypertension: Secondary | ICD-10-CM | POA: Diagnosis not present

## 2020-08-29 DIAGNOSIS — M545 Low back pain, unspecified: Secondary | ICD-10-CM | POA: Diagnosis not present

## 2020-08-29 DIAGNOSIS — E1122 Type 2 diabetes mellitus with diabetic chronic kidney disease: Secondary | ICD-10-CM | POA: Diagnosis not present

## 2020-09-21 DIAGNOSIS — R55 Syncope and collapse: Secondary | ICD-10-CM | POA: Diagnosis not present

## 2020-09-21 DIAGNOSIS — E860 Dehydration: Secondary | ICD-10-CM | POA: Diagnosis not present

## 2020-09-21 DIAGNOSIS — U071 COVID-19: Secondary | ICD-10-CM | POA: Diagnosis not present

## 2020-09-21 DIAGNOSIS — I951 Orthostatic hypotension: Secondary | ICD-10-CM | POA: Diagnosis not present

## 2020-09-21 DIAGNOSIS — E872 Acidosis: Secondary | ICD-10-CM | POA: Diagnosis not present

## 2020-10-22 DIAGNOSIS — E860 Dehydration: Secondary | ICD-10-CM | POA: Diagnosis not present

## 2020-10-22 DIAGNOSIS — R55 Syncope and collapse: Secondary | ICD-10-CM | POA: Diagnosis not present

## 2020-10-22 DIAGNOSIS — E872 Acidosis: Secondary | ICD-10-CM | POA: Diagnosis not present

## 2020-10-22 DIAGNOSIS — U071 COVID-19: Secondary | ICD-10-CM | POA: Diagnosis not present

## 2020-10-22 DIAGNOSIS — I951 Orthostatic hypotension: Secondary | ICD-10-CM | POA: Diagnosis not present

## 2020-10-30 DIAGNOSIS — K219 Gastro-esophageal reflux disease without esophagitis: Secondary | ICD-10-CM | POA: Diagnosis not present

## 2020-10-30 DIAGNOSIS — E1122 Type 2 diabetes mellitus with diabetic chronic kidney disease: Secondary | ICD-10-CM | POA: Diagnosis not present

## 2020-11-11 DIAGNOSIS — M545 Low back pain, unspecified: Secondary | ICD-10-CM | POA: Diagnosis not present

## 2020-11-11 DIAGNOSIS — E1122 Type 2 diabetes mellitus with diabetic chronic kidney disease: Secondary | ICD-10-CM | POA: Diagnosis not present

## 2020-11-11 DIAGNOSIS — K5900 Constipation, unspecified: Secondary | ICD-10-CM | POA: Diagnosis not present

## 2020-11-11 DIAGNOSIS — K2100 Gastro-esophageal reflux disease with esophagitis, without bleeding: Secondary | ICD-10-CM | POA: Diagnosis not present

## 2020-11-11 DIAGNOSIS — Z0000 Encounter for general adult medical examination without abnormal findings: Secondary | ICD-10-CM | POA: Diagnosis not present

## 2020-11-11 DIAGNOSIS — I700 Atherosclerosis of aorta: Secondary | ICD-10-CM | POA: Diagnosis not present

## 2020-11-18 ENCOUNTER — Ambulatory Visit (INDEPENDENT_AMBULATORY_CARE_PROVIDER_SITE_OTHER): Payer: Medicare Other | Admitting: Gastroenterology

## 2020-11-22 DIAGNOSIS — I951 Orthostatic hypotension: Secondary | ICD-10-CM | POA: Diagnosis not present

## 2020-11-22 DIAGNOSIS — U071 COVID-19: Secondary | ICD-10-CM | POA: Diagnosis not present

## 2020-11-22 DIAGNOSIS — R55 Syncope and collapse: Secondary | ICD-10-CM | POA: Diagnosis not present

## 2020-11-22 DIAGNOSIS — E872 Acidosis: Secondary | ICD-10-CM | POA: Diagnosis not present

## 2020-11-22 DIAGNOSIS — E860 Dehydration: Secondary | ICD-10-CM | POA: Diagnosis not present

## 2020-12-03 ENCOUNTER — Ambulatory Visit (INDEPENDENT_AMBULATORY_CARE_PROVIDER_SITE_OTHER): Payer: Medicare Other | Admitting: Gastroenterology

## 2020-12-22 DIAGNOSIS — I951 Orthostatic hypotension: Secondary | ICD-10-CM | POA: Diagnosis not present

## 2020-12-22 DIAGNOSIS — U071 COVID-19: Secondary | ICD-10-CM | POA: Diagnosis not present

## 2020-12-22 DIAGNOSIS — E860 Dehydration: Secondary | ICD-10-CM | POA: Diagnosis not present

## 2020-12-22 DIAGNOSIS — R55 Syncope and collapse: Secondary | ICD-10-CM | POA: Diagnosis not present

## 2020-12-22 DIAGNOSIS — E872 Acidosis, unspecified: Secondary | ICD-10-CM | POA: Diagnosis not present

## 2021-01-22 DIAGNOSIS — E860 Dehydration: Secondary | ICD-10-CM | POA: Diagnosis not present

## 2021-01-22 DIAGNOSIS — U071 COVID-19: Secondary | ICD-10-CM | POA: Diagnosis not present

## 2021-01-22 DIAGNOSIS — R55 Syncope and collapse: Secondary | ICD-10-CM | POA: Diagnosis not present

## 2021-01-22 DIAGNOSIS — E872 Acidosis, unspecified: Secondary | ICD-10-CM | POA: Diagnosis not present

## 2021-01-22 DIAGNOSIS — I951 Orthostatic hypotension: Secondary | ICD-10-CM | POA: Diagnosis not present

## 2021-02-06 DIAGNOSIS — Z0000 Encounter for general adult medical examination without abnormal findings: Secondary | ICD-10-CM | POA: Diagnosis not present

## 2021-02-06 DIAGNOSIS — E7849 Other hyperlipidemia: Secondary | ICD-10-CM | POA: Diagnosis not present

## 2021-02-06 DIAGNOSIS — K2100 Gastro-esophageal reflux disease with esophagitis, without bleeding: Secondary | ICD-10-CM | POA: Diagnosis not present

## 2021-02-06 DIAGNOSIS — K5900 Constipation, unspecified: Secondary | ICD-10-CM | POA: Diagnosis not present

## 2021-02-06 DIAGNOSIS — H6122 Impacted cerumen, left ear: Secondary | ICD-10-CM | POA: Diagnosis not present

## 2021-02-06 DIAGNOSIS — I10 Essential (primary) hypertension: Secondary | ICD-10-CM | POA: Diagnosis not present

## 2021-02-06 DIAGNOSIS — E1122 Type 2 diabetes mellitus with diabetic chronic kidney disease: Secondary | ICD-10-CM | POA: Diagnosis not present

## 2021-02-06 DIAGNOSIS — M545 Low back pain, unspecified: Secondary | ICD-10-CM | POA: Diagnosis not present

## 2021-02-06 DIAGNOSIS — I700 Atherosclerosis of aorta: Secondary | ICD-10-CM | POA: Diagnosis not present

## 2021-02-21 DIAGNOSIS — R55 Syncope and collapse: Secondary | ICD-10-CM | POA: Diagnosis not present

## 2021-02-21 DIAGNOSIS — U071 COVID-19: Secondary | ICD-10-CM | POA: Diagnosis not present

## 2021-02-21 DIAGNOSIS — E860 Dehydration: Secondary | ICD-10-CM | POA: Diagnosis not present

## 2021-02-21 DIAGNOSIS — E872 Acidosis, unspecified: Secondary | ICD-10-CM | POA: Diagnosis not present

## 2021-02-21 DIAGNOSIS — I951 Orthostatic hypotension: Secondary | ICD-10-CM | POA: Diagnosis not present

## 2021-02-28 DIAGNOSIS — E7849 Other hyperlipidemia: Secondary | ICD-10-CM | POA: Diagnosis not present

## 2021-02-28 DIAGNOSIS — E1122 Type 2 diabetes mellitus with diabetic chronic kidney disease: Secondary | ICD-10-CM | POA: Diagnosis not present

## 2021-02-28 DIAGNOSIS — I10 Essential (primary) hypertension: Secondary | ICD-10-CM | POA: Diagnosis not present

## 2021-06-17 DIAGNOSIS — I10 Essential (primary) hypertension: Secondary | ICD-10-CM | POA: Diagnosis not present

## 2021-06-17 DIAGNOSIS — R059 Cough, unspecified: Secondary | ICD-10-CM | POA: Diagnosis not present

## 2021-06-17 DIAGNOSIS — R6889 Other general symptoms and signs: Secondary | ICD-10-CM | POA: Diagnosis not present

## 2021-06-17 DIAGNOSIS — Z7901 Long term (current) use of anticoagulants: Secondary | ICD-10-CM | POA: Diagnosis not present

## 2021-06-17 DIAGNOSIS — E119 Type 2 diabetes mellitus without complications: Secondary | ICD-10-CM | POA: Diagnosis not present

## 2021-06-17 DIAGNOSIS — J449 Chronic obstructive pulmonary disease, unspecified: Secondary | ICD-10-CM | POA: Diagnosis not present

## 2021-06-17 DIAGNOSIS — G319 Degenerative disease of nervous system, unspecified: Secondary | ICD-10-CM | POA: Diagnosis not present

## 2021-06-17 DIAGNOSIS — R29818 Other symptoms and signs involving the nervous system: Secondary | ICD-10-CM | POA: Diagnosis not present

## 2021-06-17 DIAGNOSIS — I499 Cardiac arrhythmia, unspecified: Secondary | ICD-10-CM | POA: Diagnosis not present

## 2021-06-17 DIAGNOSIS — R7402 Elevation of levels of lactic acid dehydrogenase (LDH): Secondary | ICD-10-CM | POA: Diagnosis not present

## 2021-06-17 DIAGNOSIS — R531 Weakness: Secondary | ICD-10-CM | POA: Diagnosis not present

## 2021-06-17 DIAGNOSIS — I6782 Cerebral ischemia: Secondary | ICD-10-CM | POA: Diagnosis not present

## 2021-06-17 DIAGNOSIS — R5383 Other fatigue: Secondary | ICD-10-CM | POA: Diagnosis not present

## 2021-06-17 DIAGNOSIS — Z79899 Other long term (current) drug therapy: Secondary | ICD-10-CM | POA: Diagnosis not present

## 2021-06-17 DIAGNOSIS — I69351 Hemiplegia and hemiparesis following cerebral infarction affecting right dominant side: Secondary | ICD-10-CM | POA: Diagnosis not present

## 2021-06-17 DIAGNOSIS — R2689 Other abnormalities of gait and mobility: Secondary | ICD-10-CM | POA: Diagnosis not present

## 2021-06-17 DIAGNOSIS — F17210 Nicotine dependence, cigarettes, uncomplicated: Secondary | ICD-10-CM | POA: Diagnosis not present

## 2021-06-17 DIAGNOSIS — Z8673 Personal history of transient ischemic attack (TIA), and cerebral infarction without residual deficits: Secondary | ICD-10-CM | POA: Diagnosis not present

## 2021-06-17 DIAGNOSIS — N3000 Acute cystitis without hematuria: Secondary | ICD-10-CM | POA: Diagnosis not present

## 2021-06-17 DIAGNOSIS — Z792 Long term (current) use of antibiotics: Secondary | ICD-10-CM | POA: Diagnosis not present

## 2021-06-17 DIAGNOSIS — Z20822 Contact with and (suspected) exposure to covid-19: Secondary | ICD-10-CM | POA: Diagnosis not present

## 2021-06-17 DIAGNOSIS — R42 Dizziness and giddiness: Secondary | ICD-10-CM | POA: Diagnosis not present

## 2021-06-17 DIAGNOSIS — D72829 Elevated white blood cell count, unspecified: Secondary | ICD-10-CM | POA: Diagnosis not present

## 2021-06-17 DIAGNOSIS — Z794 Long term (current) use of insulin: Secondary | ICD-10-CM | POA: Diagnosis not present

## 2021-06-17 DIAGNOSIS — Z743 Need for continuous supervision: Secondary | ICD-10-CM | POA: Diagnosis not present

## 2021-06-17 DIAGNOSIS — R404 Transient alteration of awareness: Secondary | ICD-10-CM | POA: Diagnosis not present

## 2021-06-17 DIAGNOSIS — M549 Dorsalgia, unspecified: Secondary | ICD-10-CM | POA: Diagnosis not present

## 2021-06-18 DIAGNOSIS — R531 Weakness: Secondary | ICD-10-CM | POA: Diagnosis not present

## 2021-06-18 DIAGNOSIS — D72829 Elevated white blood cell count, unspecified: Secondary | ICD-10-CM | POA: Diagnosis not present

## 2021-06-18 DIAGNOSIS — N3000 Acute cystitis without hematuria: Secondary | ICD-10-CM | POA: Diagnosis not present

## 2021-06-18 DIAGNOSIS — I69351 Hemiplegia and hemiparesis following cerebral infarction affecting right dominant side: Secondary | ICD-10-CM | POA: Diagnosis not present

## 2021-06-18 DIAGNOSIS — Z792 Long term (current) use of antibiotics: Secondary | ICD-10-CM | POA: Diagnosis not present

## 2021-06-19 DIAGNOSIS — R531 Weakness: Secondary | ICD-10-CM | POA: Diagnosis not present

## 2021-06-19 DIAGNOSIS — Z792 Long term (current) use of antibiotics: Secondary | ICD-10-CM | POA: Diagnosis not present

## 2021-06-19 DIAGNOSIS — I69351 Hemiplegia and hemiparesis following cerebral infarction affecting right dominant side: Secondary | ICD-10-CM | POA: Diagnosis not present

## 2021-06-19 DIAGNOSIS — D72829 Elevated white blood cell count, unspecified: Secondary | ICD-10-CM | POA: Diagnosis not present

## 2021-06-19 DIAGNOSIS — N3000 Acute cystitis without hematuria: Secondary | ICD-10-CM | POA: Diagnosis not present

## 2021-06-20 DIAGNOSIS — B964 Proteus (mirabilis) (morganii) as the cause of diseases classified elsewhere: Secondary | ICD-10-CM | POA: Diagnosis not present

## 2021-06-20 DIAGNOSIS — N3000 Acute cystitis without hematuria: Secondary | ICD-10-CM | POA: Diagnosis not present

## 2021-06-20 DIAGNOSIS — D72829 Elevated white blood cell count, unspecified: Secondary | ICD-10-CM | POA: Diagnosis not present

## 2021-06-20 DIAGNOSIS — R531 Weakness: Secondary | ICD-10-CM | POA: Diagnosis not present

## 2021-06-26 DIAGNOSIS — M545 Low back pain, unspecified: Secondary | ICD-10-CM | POA: Diagnosis not present

## 2021-06-26 DIAGNOSIS — N3091 Cystitis, unspecified with hematuria: Secondary | ICD-10-CM | POA: Diagnosis not present

## 2021-10-01 DIAGNOSIS — R531 Weakness: Secondary | ICD-10-CM | POA: Diagnosis not present

## 2021-10-01 DIAGNOSIS — E1149 Type 2 diabetes mellitus with other diabetic neurological complication: Secondary | ICD-10-CM | POA: Diagnosis not present

## 2021-10-01 DIAGNOSIS — N3091 Cystitis, unspecified with hematuria: Secondary | ICD-10-CM | POA: Diagnosis not present

## 2021-10-01 DIAGNOSIS — M545 Low back pain, unspecified: Secondary | ICD-10-CM | POA: Diagnosis not present

## 2021-10-01 DIAGNOSIS — K2100 Gastro-esophageal reflux disease with esophagitis, without bleeding: Secondary | ICD-10-CM | POA: Diagnosis not present

## 2021-10-01 DIAGNOSIS — E7849 Other hyperlipidemia: Secondary | ICD-10-CM | POA: Diagnosis not present

## 2021-10-01 DIAGNOSIS — Z0000 Encounter for general adult medical examination without abnormal findings: Secondary | ICD-10-CM | POA: Diagnosis not present

## 2021-10-20 DIAGNOSIS — I499 Cardiac arrhythmia, unspecified: Secondary | ICD-10-CM | POA: Diagnosis not present

## 2021-10-20 DIAGNOSIS — M8588 Other specified disorders of bone density and structure, other site: Secondary | ICD-10-CM | POA: Diagnosis not present

## 2021-10-20 DIAGNOSIS — Z79899 Other long term (current) drug therapy: Secondary | ICD-10-CM | POA: Diagnosis not present

## 2021-10-20 DIAGNOSIS — R6889 Other general symptoms and signs: Secondary | ICD-10-CM | POA: Diagnosis not present

## 2021-10-20 DIAGNOSIS — N3090 Cystitis, unspecified without hematuria: Secondary | ICD-10-CM | POA: Diagnosis not present

## 2021-10-20 DIAGNOSIS — M549 Dorsalgia, unspecified: Secondary | ICD-10-CM | POA: Diagnosis not present

## 2021-10-20 DIAGNOSIS — S0990XA Unspecified injury of head, initial encounter: Secondary | ICD-10-CM | POA: Diagnosis not present

## 2021-10-20 DIAGNOSIS — Z043 Encounter for examination and observation following other accident: Secondary | ICD-10-CM | POA: Diagnosis not present

## 2021-10-20 DIAGNOSIS — Z7984 Long term (current) use of oral hypoglycemic drugs: Secondary | ICD-10-CM | POA: Diagnosis not present

## 2021-10-20 DIAGNOSIS — S80862A Insect bite (nonvenomous), left lower leg, initial encounter: Secondary | ICD-10-CM | POA: Diagnosis not present

## 2021-10-20 DIAGNOSIS — M5030 Other cervical disc degeneration, unspecified cervical region: Secondary | ICD-10-CM | POA: Diagnosis not present

## 2021-10-20 DIAGNOSIS — J449 Chronic obstructive pulmonary disease, unspecified: Secondary | ICD-10-CM | POA: Diagnosis not present

## 2021-10-20 DIAGNOSIS — W57XXXA Bitten or stung by nonvenomous insect and other nonvenomous arthropods, initial encounter: Secondary | ICD-10-CM | POA: Diagnosis not present

## 2021-10-20 DIAGNOSIS — Z743 Need for continuous supervision: Secondary | ICD-10-CM | POA: Diagnosis not present

## 2021-10-20 DIAGNOSIS — R404 Transient alteration of awareness: Secondary | ICD-10-CM | POA: Diagnosis not present

## 2021-10-20 DIAGNOSIS — I7143 Infrarenal abdominal aortic aneurysm, without rupture: Secondary | ICD-10-CM | POA: Diagnosis not present

## 2021-10-20 DIAGNOSIS — I714 Abdominal aortic aneurysm, without rupture, unspecified: Secondary | ICD-10-CM | POA: Diagnosis not present

## 2021-10-20 DIAGNOSIS — F17210 Nicotine dependence, cigarettes, uncomplicated: Secondary | ICD-10-CM | POA: Diagnosis not present

## 2021-10-20 DIAGNOSIS — G939 Disorder of brain, unspecified: Secondary | ICD-10-CM | POA: Diagnosis not present

## 2021-10-20 DIAGNOSIS — R52 Pain, unspecified: Secondary | ICD-10-CM | POA: Diagnosis not present

## 2021-10-20 DIAGNOSIS — N323 Diverticulum of bladder: Secondary | ICD-10-CM | POA: Diagnosis not present

## 2021-10-20 DIAGNOSIS — S80861A Insect bite (nonvenomous), right lower leg, initial encounter: Secondary | ICD-10-CM | POA: Diagnosis not present

## 2021-11-25 DIAGNOSIS — R42 Dizziness and giddiness: Secondary | ICD-10-CM | POA: Diagnosis not present

## 2021-11-25 DIAGNOSIS — R52 Pain, unspecified: Secondary | ICD-10-CM | POA: Diagnosis not present

## 2021-12-04 DIAGNOSIS — Z743 Need for continuous supervision: Secondary | ICD-10-CM | POA: Diagnosis not present

## 2021-12-04 DIAGNOSIS — Z792 Long term (current) use of antibiotics: Secondary | ICD-10-CM | POA: Diagnosis not present

## 2021-12-04 DIAGNOSIS — Z7984 Long term (current) use of oral hypoglycemic drugs: Secondary | ICD-10-CM | POA: Diagnosis not present

## 2021-12-04 DIAGNOSIS — E441 Mild protein-calorie malnutrition: Secondary | ICD-10-CM | POA: Diagnosis not present

## 2021-12-04 DIAGNOSIS — E119 Type 2 diabetes mellitus without complications: Secondary | ICD-10-CM | POA: Diagnosis not present

## 2021-12-04 DIAGNOSIS — J439 Emphysema, unspecified: Secondary | ICD-10-CM | POA: Diagnosis not present

## 2021-12-04 DIAGNOSIS — N3000 Acute cystitis without hematuria: Secondary | ICD-10-CM | POA: Diagnosis not present

## 2021-12-04 DIAGNOSIS — E871 Hypo-osmolality and hyponatremia: Secondary | ICD-10-CM | POA: Diagnosis not present

## 2021-12-04 DIAGNOSIS — R5381 Other malaise: Secondary | ICD-10-CM | POA: Diagnosis not present

## 2021-12-04 DIAGNOSIS — E1142 Type 2 diabetes mellitus with diabetic polyneuropathy: Secondary | ICD-10-CM | POA: Diagnosis not present

## 2021-12-04 DIAGNOSIS — N390 Urinary tract infection, site not specified: Secondary | ICD-10-CM | POA: Diagnosis not present

## 2021-12-04 DIAGNOSIS — Z79899 Other long term (current) drug therapy: Secondary | ICD-10-CM | POA: Diagnosis not present

## 2021-12-04 DIAGNOSIS — E875 Hyperkalemia: Secondary | ICD-10-CM | POA: Diagnosis not present

## 2021-12-04 DIAGNOSIS — J690 Pneumonitis due to inhalation of food and vomit: Secondary | ICD-10-CM | POA: Diagnosis not present

## 2021-12-04 DIAGNOSIS — I951 Orthostatic hypotension: Secondary | ICD-10-CM | POA: Diagnosis not present

## 2021-12-04 DIAGNOSIS — R6889 Other general symptoms and signs: Secondary | ICD-10-CM | POA: Diagnosis not present

## 2021-12-04 DIAGNOSIS — F17210 Nicotine dependence, cigarettes, uncomplicated: Secondary | ICD-10-CM | POA: Diagnosis not present

## 2021-12-04 DIAGNOSIS — Z7401 Bed confinement status: Secondary | ICD-10-CM | POA: Diagnosis not present

## 2021-12-04 DIAGNOSIS — E872 Acidosis, unspecified: Secondary | ICD-10-CM | POA: Diagnosis not present

## 2021-12-04 DIAGNOSIS — A419 Sepsis, unspecified organism: Secondary | ICD-10-CM | POA: Diagnosis not present

## 2021-12-04 DIAGNOSIS — R1310 Dysphagia, unspecified: Secondary | ICD-10-CM | POA: Diagnosis not present

## 2021-12-04 DIAGNOSIS — Z20822 Contact with and (suspected) exposure to covid-19: Secondary | ICD-10-CM | POA: Diagnosis not present

## 2021-12-04 DIAGNOSIS — R7402 Elevation of levels of lactic acid dehydrogenase (LDH): Secondary | ICD-10-CM | POA: Diagnosis not present

## 2021-12-04 DIAGNOSIS — Z885 Allergy status to narcotic agent status: Secondary | ICD-10-CM | POA: Diagnosis not present

## 2021-12-04 DIAGNOSIS — R918 Other nonspecific abnormal finding of lung field: Secondary | ICD-10-CM | POA: Diagnosis not present

## 2021-12-04 DIAGNOSIS — E46 Unspecified protein-calorie malnutrition: Secondary | ICD-10-CM | POA: Diagnosis not present

## 2021-12-04 DIAGNOSIS — J449 Chronic obstructive pulmonary disease, unspecified: Secondary | ICD-10-CM | POA: Diagnosis not present

## 2021-12-04 DIAGNOSIS — Z6822 Body mass index (BMI) 22.0-22.9, adult: Secondary | ICD-10-CM | POA: Diagnosis not present

## 2021-12-04 DIAGNOSIS — E860 Dehydration: Secondary | ICD-10-CM | POA: Diagnosis not present

## 2021-12-04 DIAGNOSIS — R54 Age-related physical debility: Secondary | ICD-10-CM | POA: Diagnosis not present

## 2021-12-04 DIAGNOSIS — R531 Weakness: Secondary | ICD-10-CM | POA: Diagnosis not present

## 2021-12-04 DIAGNOSIS — G8929 Other chronic pain: Secondary | ICD-10-CM | POA: Diagnosis not present

## 2021-12-04 DIAGNOSIS — R29898 Other symptoms and signs involving the musculoskeletal system: Secondary | ICD-10-CM | POA: Diagnosis not present

## 2021-12-04 DIAGNOSIS — E782 Mixed hyperlipidemia: Secondary | ICD-10-CM | POA: Diagnosis not present

## 2021-12-04 DIAGNOSIS — B964 Proteus (mirabilis) (morganii) as the cause of diseases classified elsewhere: Secondary | ICD-10-CM | POA: Diagnosis not present

## 2021-12-04 DIAGNOSIS — R911 Solitary pulmonary nodule: Secondary | ICD-10-CM | POA: Diagnosis not present

## 2021-12-04 DIAGNOSIS — W57XXXD Bitten or stung by nonvenomous insect and other nonvenomous arthropods, subsequent encounter: Secondary | ICD-10-CM | POA: Diagnosis not present

## 2021-12-04 DIAGNOSIS — I700 Atherosclerosis of aorta: Secondary | ICD-10-CM | POA: Diagnosis not present

## 2021-12-04 DIAGNOSIS — R279 Unspecified lack of coordination: Secondary | ICD-10-CM | POA: Diagnosis not present

## 2021-12-04 DIAGNOSIS — M549 Dorsalgia, unspecified: Secondary | ICD-10-CM | POA: Diagnosis not present

## 2021-12-04 DIAGNOSIS — J984 Other disorders of lung: Secondary | ICD-10-CM | POA: Diagnosis not present

## 2021-12-04 DIAGNOSIS — Z66 Do not resuscitate: Secondary | ICD-10-CM | POA: Diagnosis not present

## 2021-12-04 DIAGNOSIS — I10 Essential (primary) hypertension: Secondary | ICD-10-CM | POA: Diagnosis not present

## 2021-12-13 DIAGNOSIS — A419 Sepsis, unspecified organism: Secondary | ICD-10-CM | POA: Diagnosis not present

## 2021-12-13 DIAGNOSIS — I951 Orthostatic hypotension: Secondary | ICD-10-CM | POA: Diagnosis not present

## 2021-12-13 DIAGNOSIS — E860 Dehydration: Secondary | ICD-10-CM | POA: Diagnosis not present

## 2021-12-13 DIAGNOSIS — U071 COVID-19: Secondary | ICD-10-CM | POA: Diagnosis not present

## 2021-12-13 DIAGNOSIS — R55 Syncope and collapse: Secondary | ICD-10-CM | POA: Diagnosis not present

## 2021-12-31 DIAGNOSIS — I700 Atherosclerosis of aorta: Secondary | ICD-10-CM | POA: Diagnosis not present

## 2021-12-31 DIAGNOSIS — E7849 Other hyperlipidemia: Secondary | ICD-10-CM | POA: Diagnosis not present

## 2021-12-31 DIAGNOSIS — Z0000 Encounter for general adult medical examination without abnormal findings: Secondary | ICD-10-CM | POA: Diagnosis not present

## 2021-12-31 DIAGNOSIS — I10 Essential (primary) hypertension: Secondary | ICD-10-CM | POA: Diagnosis not present

## 2021-12-31 DIAGNOSIS — N182 Chronic kidney disease, stage 2 (mild): Secondary | ICD-10-CM | POA: Diagnosis not present

## 2022-01-13 DIAGNOSIS — A419 Sepsis, unspecified organism: Secondary | ICD-10-CM | POA: Diagnosis not present

## 2022-01-13 DIAGNOSIS — I951 Orthostatic hypotension: Secondary | ICD-10-CM | POA: Diagnosis not present

## 2022-01-13 DIAGNOSIS — E860 Dehydration: Secondary | ICD-10-CM | POA: Diagnosis not present

## 2022-01-13 DIAGNOSIS — R55 Syncope and collapse: Secondary | ICD-10-CM | POA: Diagnosis not present

## 2022-01-13 DIAGNOSIS — U071 COVID-19: Secondary | ICD-10-CM | POA: Diagnosis not present

## 2022-02-12 DIAGNOSIS — U071 COVID-19: Secondary | ICD-10-CM | POA: Diagnosis not present

## 2022-02-12 DIAGNOSIS — E860 Dehydration: Secondary | ICD-10-CM | POA: Diagnosis not present

## 2022-02-12 DIAGNOSIS — R55 Syncope and collapse: Secondary | ICD-10-CM | POA: Diagnosis not present

## 2022-02-12 DIAGNOSIS — A419 Sepsis, unspecified organism: Secondary | ICD-10-CM | POA: Diagnosis not present

## 2022-02-12 DIAGNOSIS — I951 Orthostatic hypotension: Secondary | ICD-10-CM | POA: Diagnosis not present

## 2022-03-15 DIAGNOSIS — E860 Dehydration: Secondary | ICD-10-CM | POA: Diagnosis not present

## 2022-03-15 DIAGNOSIS — I951 Orthostatic hypotension: Secondary | ICD-10-CM | POA: Diagnosis not present

## 2022-03-15 DIAGNOSIS — A419 Sepsis, unspecified organism: Secondary | ICD-10-CM | POA: Diagnosis not present

## 2022-03-15 DIAGNOSIS — U071 COVID-19: Secondary | ICD-10-CM | POA: Diagnosis not present

## 2022-03-15 DIAGNOSIS — R55 Syncope and collapse: Secondary | ICD-10-CM | POA: Diagnosis not present

## 2022-03-19 DIAGNOSIS — Z0000 Encounter for general adult medical examination without abnormal findings: Secondary | ICD-10-CM | POA: Diagnosis not present

## 2022-03-19 DIAGNOSIS — E7849 Other hyperlipidemia: Secondary | ICD-10-CM | POA: Diagnosis not present

## 2022-03-19 DIAGNOSIS — I10 Essential (primary) hypertension: Secondary | ICD-10-CM | POA: Diagnosis not present

## 2022-03-19 DIAGNOSIS — N182 Chronic kidney disease, stage 2 (mild): Secondary | ICD-10-CM | POA: Diagnosis not present

## 2022-03-19 DIAGNOSIS — H6122 Impacted cerumen, left ear: Secondary | ICD-10-CM | POA: Diagnosis not present

## 2022-03-19 DIAGNOSIS — I700 Atherosclerosis of aorta: Secondary | ICD-10-CM | POA: Diagnosis not present

## 2022-04-15 DIAGNOSIS — R55 Syncope and collapse: Secondary | ICD-10-CM | POA: Diagnosis not present

## 2022-04-15 DIAGNOSIS — A419 Sepsis, unspecified organism: Secondary | ICD-10-CM | POA: Diagnosis not present

## 2022-04-15 DIAGNOSIS — U071 COVID-19: Secondary | ICD-10-CM | POA: Diagnosis not present

## 2022-04-15 DIAGNOSIS — E860 Dehydration: Secondary | ICD-10-CM | POA: Diagnosis not present

## 2022-04-15 DIAGNOSIS — I951 Orthostatic hypotension: Secondary | ICD-10-CM | POA: Diagnosis not present

## 2022-05-14 DIAGNOSIS — R55 Syncope and collapse: Secondary | ICD-10-CM | POA: Diagnosis not present

## 2022-05-14 DIAGNOSIS — E860 Dehydration: Secondary | ICD-10-CM | POA: Diagnosis not present

## 2022-05-14 DIAGNOSIS — U071 COVID-19: Secondary | ICD-10-CM | POA: Diagnosis not present

## 2022-05-14 DIAGNOSIS — A419 Sepsis, unspecified organism: Secondary | ICD-10-CM | POA: Diagnosis not present

## 2022-05-14 DIAGNOSIS — I951 Orthostatic hypotension: Secondary | ICD-10-CM | POA: Diagnosis not present

## 2022-06-14 DIAGNOSIS — E860 Dehydration: Secondary | ICD-10-CM | POA: Diagnosis not present

## 2022-06-14 DIAGNOSIS — I951 Orthostatic hypotension: Secondary | ICD-10-CM | POA: Diagnosis not present

## 2022-06-14 DIAGNOSIS — U071 COVID-19: Secondary | ICD-10-CM | POA: Diagnosis not present

## 2022-06-14 DIAGNOSIS — R55 Syncope and collapse: Secondary | ICD-10-CM | POA: Diagnosis not present

## 2022-06-14 DIAGNOSIS — A419 Sepsis, unspecified organism: Secondary | ICD-10-CM | POA: Diagnosis not present

## 2022-06-18 DIAGNOSIS — I700 Atherosclerosis of aorta: Secondary | ICD-10-CM | POA: Diagnosis not present

## 2022-06-18 DIAGNOSIS — H6122 Impacted cerumen, left ear: Secondary | ICD-10-CM | POA: Diagnosis not present

## 2022-06-18 DIAGNOSIS — E1143 Type 2 diabetes mellitus with diabetic autonomic (poly)neuropathy: Secondary | ICD-10-CM | POA: Diagnosis not present

## 2022-06-18 DIAGNOSIS — N182 Chronic kidney disease, stage 2 (mild): Secondary | ICD-10-CM | POA: Diagnosis not present

## 2022-06-18 DIAGNOSIS — E7849 Other hyperlipidemia: Secondary | ICD-10-CM | POA: Diagnosis not present

## 2022-06-18 DIAGNOSIS — Z0000 Encounter for general adult medical examination without abnormal findings: Secondary | ICD-10-CM | POA: Diagnosis not present

## 2022-06-18 DIAGNOSIS — I10 Essential (primary) hypertension: Secondary | ICD-10-CM | POA: Diagnosis not present

## 2022-07-14 DIAGNOSIS — R55 Syncope and collapse: Secondary | ICD-10-CM | POA: Diagnosis not present

## 2022-07-14 DIAGNOSIS — A419 Sepsis, unspecified organism: Secondary | ICD-10-CM | POA: Diagnosis not present

## 2022-07-14 DIAGNOSIS — E860 Dehydration: Secondary | ICD-10-CM | POA: Diagnosis not present

## 2022-07-14 DIAGNOSIS — U071 COVID-19: Secondary | ICD-10-CM | POA: Diagnosis not present

## 2022-07-14 DIAGNOSIS — I951 Orthostatic hypotension: Secondary | ICD-10-CM | POA: Diagnosis not present

## 2022-07-21 DIAGNOSIS — M549 Dorsalgia, unspecified: Secondary | ICD-10-CM | POA: Diagnosis not present

## 2022-07-21 DIAGNOSIS — A419 Sepsis, unspecified organism: Secondary | ICD-10-CM | POA: Diagnosis not present

## 2022-07-21 DIAGNOSIS — Z7984 Long term (current) use of oral hypoglycemic drugs: Secondary | ICD-10-CM | POA: Diagnosis not present

## 2022-07-21 DIAGNOSIS — J441 Chronic obstructive pulmonary disease with (acute) exacerbation: Secondary | ICD-10-CM | POA: Diagnosis not present

## 2022-07-21 DIAGNOSIS — J9601 Acute respiratory failure with hypoxia: Secondary | ICD-10-CM | POA: Diagnosis not present

## 2022-07-21 DIAGNOSIS — Z1152 Encounter for screening for COVID-19: Secondary | ICD-10-CM | POA: Diagnosis not present

## 2022-07-21 DIAGNOSIS — Z7401 Bed confinement status: Secondary | ICD-10-CM | POA: Diagnosis not present

## 2022-07-21 DIAGNOSIS — F17210 Nicotine dependence, cigarettes, uncomplicated: Secondary | ICD-10-CM | POA: Diagnosis not present

## 2022-07-21 DIAGNOSIS — E871 Hypo-osmolality and hyponatremia: Secondary | ICD-10-CM | POA: Diagnosis not present

## 2022-07-21 DIAGNOSIS — R531 Weakness: Secondary | ICD-10-CM | POA: Diagnosis not present

## 2022-07-21 DIAGNOSIS — R0689 Other abnormalities of breathing: Secondary | ICD-10-CM | POA: Diagnosis not present

## 2022-07-21 DIAGNOSIS — Z931 Gastrostomy status: Secondary | ICD-10-CM | POA: Diagnosis not present

## 2022-07-21 DIAGNOSIS — E782 Mixed hyperlipidemia: Secondary | ICD-10-CM | POA: Diagnosis not present

## 2022-07-21 DIAGNOSIS — R6889 Other general symptoms and signs: Secondary | ICD-10-CM | POA: Diagnosis not present

## 2022-07-21 DIAGNOSIS — I959 Hypotension, unspecified: Secondary | ICD-10-CM | POA: Diagnosis not present

## 2022-07-21 DIAGNOSIS — Z743 Need for continuous supervision: Secondary | ICD-10-CM | POA: Diagnosis not present

## 2022-07-21 DIAGNOSIS — J440 Chronic obstructive pulmonary disease with acute lower respiratory infection: Secondary | ICD-10-CM | POA: Diagnosis not present

## 2022-07-21 DIAGNOSIS — R262 Difficulty in walking, not elsewhere classified: Secondary | ICD-10-CM | POA: Diagnosis not present

## 2022-07-21 DIAGNOSIS — E119 Type 2 diabetes mellitus without complications: Secondary | ICD-10-CM | POA: Diagnosis not present

## 2022-07-21 DIAGNOSIS — R6520 Severe sepsis without septic shock: Secondary | ICD-10-CM | POA: Diagnosis not present

## 2022-07-21 DIAGNOSIS — Z720 Tobacco use: Secondary | ICD-10-CM | POA: Diagnosis not present

## 2022-07-21 DIAGNOSIS — Z20822 Contact with and (suspected) exposure to covid-19: Secondary | ICD-10-CM | POA: Diagnosis not present

## 2022-07-21 DIAGNOSIS — R509 Fever, unspecified: Secondary | ICD-10-CM | POA: Diagnosis not present

## 2022-07-21 DIAGNOSIS — I10 Essential (primary) hypertension: Secondary | ICD-10-CM | POA: Diagnosis not present

## 2022-07-21 DIAGNOSIS — I950 Idiopathic hypotension: Secondary | ICD-10-CM | POA: Diagnosis not present

## 2022-07-21 DIAGNOSIS — I499 Cardiac arrhythmia, unspecified: Secondary | ICD-10-CM | POA: Diagnosis not present

## 2022-07-21 DIAGNOSIS — R000 Tachycardia, unspecified: Secondary | ICD-10-CM | POA: Diagnosis not present

## 2022-07-21 DIAGNOSIS — G8929 Other chronic pain: Secondary | ICD-10-CM | POA: Diagnosis not present

## 2022-07-21 DIAGNOSIS — N3000 Acute cystitis without hematuria: Secondary | ICD-10-CM | POA: Diagnosis not present

## 2022-07-21 DIAGNOSIS — Z79899 Other long term (current) drug therapy: Secondary | ICD-10-CM | POA: Diagnosis not present

## 2022-07-21 DIAGNOSIS — Z635 Disruption of family by separation and divorce: Secondary | ICD-10-CM | POA: Diagnosis not present

## 2022-07-21 DIAGNOSIS — J9811 Atelectasis: Secondary | ICD-10-CM | POA: Diagnosis not present

## 2022-07-21 DIAGNOSIS — N390 Urinary tract infection, site not specified: Secondary | ICD-10-CM | POA: Diagnosis not present

## 2022-07-21 DIAGNOSIS — R1310 Dysphagia, unspecified: Secondary | ICD-10-CM | POA: Diagnosis not present

## 2022-07-21 DIAGNOSIS — Z885 Allergy status to narcotic agent status: Secondary | ICD-10-CM | POA: Diagnosis not present

## 2022-07-21 DIAGNOSIS — Z792 Long term (current) use of antibiotics: Secondary | ICD-10-CM | POA: Diagnosis not present

## 2022-07-24 DIAGNOSIS — Z8673 Personal history of transient ischemic attack (TIA), and cerebral infarction without residual deficits: Secondary | ICD-10-CM | POA: Diagnosis not present

## 2022-07-24 DIAGNOSIS — M549 Dorsalgia, unspecified: Secondary | ICD-10-CM | POA: Diagnosis not present

## 2022-07-24 DIAGNOSIS — E119 Type 2 diabetes mellitus without complications: Secondary | ICD-10-CM | POA: Diagnosis not present

## 2022-07-24 DIAGNOSIS — G8929 Other chronic pain: Secondary | ICD-10-CM | POA: Diagnosis not present

## 2022-07-24 DIAGNOSIS — Z7984 Long term (current) use of oral hypoglycemic drugs: Secondary | ICD-10-CM | POA: Diagnosis not present

## 2022-07-24 DIAGNOSIS — E871 Hypo-osmolality and hyponatremia: Secondary | ICD-10-CM | POA: Diagnosis not present

## 2022-07-24 DIAGNOSIS — Z7951 Long term (current) use of inhaled steroids: Secondary | ICD-10-CM | POA: Diagnosis not present

## 2022-07-24 DIAGNOSIS — I10 Essential (primary) hypertension: Secondary | ICD-10-CM | POA: Diagnosis not present

## 2022-07-24 DIAGNOSIS — J440 Chronic obstructive pulmonary disease with acute lower respiratory infection: Secondary | ICD-10-CM | POA: Diagnosis not present

## 2022-07-24 DIAGNOSIS — E46 Unspecified protein-calorie malnutrition: Secondary | ICD-10-CM | POA: Diagnosis not present

## 2022-07-24 DIAGNOSIS — F17210 Nicotine dependence, cigarettes, uncomplicated: Secondary | ICD-10-CM | POA: Diagnosis not present

## 2022-07-24 DIAGNOSIS — J441 Chronic obstructive pulmonary disease with (acute) exacerbation: Secondary | ICD-10-CM | POA: Diagnosis not present

## 2022-07-24 DIAGNOSIS — Z993 Dependence on wheelchair: Secondary | ICD-10-CM | POA: Diagnosis not present

## 2022-07-24 DIAGNOSIS — E782 Mixed hyperlipidemia: Secondary | ICD-10-CM | POA: Diagnosis not present

## 2022-07-24 DIAGNOSIS — I951 Orthostatic hypotension: Secondary | ICD-10-CM | POA: Diagnosis not present

## 2022-07-24 DIAGNOSIS — N3000 Acute cystitis without hematuria: Secondary | ICD-10-CM | POA: Diagnosis not present

## 2022-07-27 DIAGNOSIS — E782 Mixed hyperlipidemia: Secondary | ICD-10-CM | POA: Diagnosis not present

## 2022-07-27 DIAGNOSIS — I10 Essential (primary) hypertension: Secondary | ICD-10-CM | POA: Diagnosis not present

## 2022-07-27 DIAGNOSIS — E119 Type 2 diabetes mellitus without complications: Secondary | ICD-10-CM | POA: Diagnosis not present

## 2022-07-27 DIAGNOSIS — Z7951 Long term (current) use of inhaled steroids: Secondary | ICD-10-CM | POA: Diagnosis not present

## 2022-07-27 DIAGNOSIS — E871 Hypo-osmolality and hyponatremia: Secondary | ICD-10-CM | POA: Diagnosis not present

## 2022-07-27 DIAGNOSIS — G8929 Other chronic pain: Secondary | ICD-10-CM | POA: Diagnosis not present

## 2022-07-27 DIAGNOSIS — N3000 Acute cystitis without hematuria: Secondary | ICD-10-CM | POA: Diagnosis not present

## 2022-07-27 DIAGNOSIS — I951 Orthostatic hypotension: Secondary | ICD-10-CM | POA: Diagnosis not present

## 2022-07-27 DIAGNOSIS — J440 Chronic obstructive pulmonary disease with acute lower respiratory infection: Secondary | ICD-10-CM | POA: Diagnosis not present

## 2022-07-27 DIAGNOSIS — Z7984 Long term (current) use of oral hypoglycemic drugs: Secondary | ICD-10-CM | POA: Diagnosis not present

## 2022-07-27 DIAGNOSIS — Z8673 Personal history of transient ischemic attack (TIA), and cerebral infarction without residual deficits: Secondary | ICD-10-CM | POA: Diagnosis not present

## 2022-07-27 DIAGNOSIS — F17210 Nicotine dependence, cigarettes, uncomplicated: Secondary | ICD-10-CM | POA: Diagnosis not present

## 2022-07-27 DIAGNOSIS — J441 Chronic obstructive pulmonary disease with (acute) exacerbation: Secondary | ICD-10-CM | POA: Diagnosis not present

## 2022-07-27 DIAGNOSIS — Z993 Dependence on wheelchair: Secondary | ICD-10-CM | POA: Diagnosis not present

## 2022-07-27 DIAGNOSIS — M549 Dorsalgia, unspecified: Secondary | ICD-10-CM | POA: Diagnosis not present

## 2022-07-27 DIAGNOSIS — E46 Unspecified protein-calorie malnutrition: Secondary | ICD-10-CM | POA: Diagnosis not present

## 2022-07-28 DIAGNOSIS — Z993 Dependence on wheelchair: Secondary | ICD-10-CM | POA: Diagnosis not present

## 2022-07-28 DIAGNOSIS — Z7984 Long term (current) use of oral hypoglycemic drugs: Secondary | ICD-10-CM | POA: Diagnosis not present

## 2022-07-28 DIAGNOSIS — I951 Orthostatic hypotension: Secondary | ICD-10-CM | POA: Diagnosis not present

## 2022-07-28 DIAGNOSIS — N3000 Acute cystitis without hematuria: Secondary | ICD-10-CM | POA: Diagnosis not present

## 2022-07-28 DIAGNOSIS — J440 Chronic obstructive pulmonary disease with acute lower respiratory infection: Secondary | ICD-10-CM | POA: Diagnosis not present

## 2022-07-28 DIAGNOSIS — Z7951 Long term (current) use of inhaled steroids: Secondary | ICD-10-CM | POA: Diagnosis not present

## 2022-07-28 DIAGNOSIS — M549 Dorsalgia, unspecified: Secondary | ICD-10-CM | POA: Diagnosis not present

## 2022-07-28 DIAGNOSIS — J441 Chronic obstructive pulmonary disease with (acute) exacerbation: Secondary | ICD-10-CM | POA: Diagnosis not present

## 2022-07-28 DIAGNOSIS — F17210 Nicotine dependence, cigarettes, uncomplicated: Secondary | ICD-10-CM | POA: Diagnosis not present

## 2022-07-28 DIAGNOSIS — E46 Unspecified protein-calorie malnutrition: Secondary | ICD-10-CM | POA: Diagnosis not present

## 2022-07-28 DIAGNOSIS — E871 Hypo-osmolality and hyponatremia: Secondary | ICD-10-CM | POA: Diagnosis not present

## 2022-07-28 DIAGNOSIS — I10 Essential (primary) hypertension: Secondary | ICD-10-CM | POA: Diagnosis not present

## 2022-07-28 DIAGNOSIS — E782 Mixed hyperlipidemia: Secondary | ICD-10-CM | POA: Diagnosis not present

## 2022-07-28 DIAGNOSIS — G8929 Other chronic pain: Secondary | ICD-10-CM | POA: Diagnosis not present

## 2022-07-28 DIAGNOSIS — Z8673 Personal history of transient ischemic attack (TIA), and cerebral infarction without residual deficits: Secondary | ICD-10-CM | POA: Diagnosis not present

## 2022-07-28 DIAGNOSIS — E119 Type 2 diabetes mellitus without complications: Secondary | ICD-10-CM | POA: Diagnosis not present

## 2022-07-31 DIAGNOSIS — N3000 Acute cystitis without hematuria: Secondary | ICD-10-CM | POA: Diagnosis not present

## 2022-07-31 DIAGNOSIS — M549 Dorsalgia, unspecified: Secondary | ICD-10-CM | POA: Diagnosis not present

## 2022-07-31 DIAGNOSIS — E119 Type 2 diabetes mellitus without complications: Secondary | ICD-10-CM | POA: Diagnosis not present

## 2022-07-31 DIAGNOSIS — Z7984 Long term (current) use of oral hypoglycemic drugs: Secondary | ICD-10-CM | POA: Diagnosis not present

## 2022-07-31 DIAGNOSIS — Z8673 Personal history of transient ischemic attack (TIA), and cerebral infarction without residual deficits: Secondary | ICD-10-CM | POA: Diagnosis not present

## 2022-07-31 DIAGNOSIS — E46 Unspecified protein-calorie malnutrition: Secondary | ICD-10-CM | POA: Diagnosis not present

## 2022-07-31 DIAGNOSIS — E871 Hypo-osmolality and hyponatremia: Secondary | ICD-10-CM | POA: Diagnosis not present

## 2022-07-31 DIAGNOSIS — Z993 Dependence on wheelchair: Secondary | ICD-10-CM | POA: Diagnosis not present

## 2022-07-31 DIAGNOSIS — G8929 Other chronic pain: Secondary | ICD-10-CM | POA: Diagnosis not present

## 2022-07-31 DIAGNOSIS — Z7951 Long term (current) use of inhaled steroids: Secondary | ICD-10-CM | POA: Diagnosis not present

## 2022-07-31 DIAGNOSIS — I10 Essential (primary) hypertension: Secondary | ICD-10-CM | POA: Diagnosis not present

## 2022-07-31 DIAGNOSIS — J441 Chronic obstructive pulmonary disease with (acute) exacerbation: Secondary | ICD-10-CM | POA: Diagnosis not present

## 2022-07-31 DIAGNOSIS — E782 Mixed hyperlipidemia: Secondary | ICD-10-CM | POA: Diagnosis not present

## 2022-07-31 DIAGNOSIS — F17210 Nicotine dependence, cigarettes, uncomplicated: Secondary | ICD-10-CM | POA: Diagnosis not present

## 2022-07-31 DIAGNOSIS — I951 Orthostatic hypotension: Secondary | ICD-10-CM | POA: Diagnosis not present

## 2022-07-31 DIAGNOSIS — J440 Chronic obstructive pulmonary disease with acute lower respiratory infection: Secondary | ICD-10-CM | POA: Diagnosis not present

## 2022-08-04 DIAGNOSIS — M549 Dorsalgia, unspecified: Secondary | ICD-10-CM | POA: Diagnosis not present

## 2022-08-04 DIAGNOSIS — G8929 Other chronic pain: Secondary | ICD-10-CM | POA: Diagnosis not present

## 2022-08-04 DIAGNOSIS — Z993 Dependence on wheelchair: Secondary | ICD-10-CM | POA: Diagnosis not present

## 2022-08-04 DIAGNOSIS — N3000 Acute cystitis without hematuria: Secondary | ICD-10-CM | POA: Diagnosis not present

## 2022-08-04 DIAGNOSIS — F17210 Nicotine dependence, cigarettes, uncomplicated: Secondary | ICD-10-CM | POA: Diagnosis not present

## 2022-08-04 DIAGNOSIS — E119 Type 2 diabetes mellitus without complications: Secondary | ICD-10-CM | POA: Diagnosis not present

## 2022-08-04 DIAGNOSIS — E871 Hypo-osmolality and hyponatremia: Secondary | ICD-10-CM | POA: Diagnosis not present

## 2022-08-04 DIAGNOSIS — Z8673 Personal history of transient ischemic attack (TIA), and cerebral infarction without residual deficits: Secondary | ICD-10-CM | POA: Diagnosis not present

## 2022-08-04 DIAGNOSIS — I10 Essential (primary) hypertension: Secondary | ICD-10-CM | POA: Diagnosis not present

## 2022-08-04 DIAGNOSIS — E46 Unspecified protein-calorie malnutrition: Secondary | ICD-10-CM | POA: Diagnosis not present

## 2022-08-04 DIAGNOSIS — Z7984 Long term (current) use of oral hypoglycemic drugs: Secondary | ICD-10-CM | POA: Diagnosis not present

## 2022-08-04 DIAGNOSIS — I951 Orthostatic hypotension: Secondary | ICD-10-CM | POA: Diagnosis not present

## 2022-08-04 DIAGNOSIS — J441 Chronic obstructive pulmonary disease with (acute) exacerbation: Secondary | ICD-10-CM | POA: Diagnosis not present

## 2022-08-04 DIAGNOSIS — J440 Chronic obstructive pulmonary disease with acute lower respiratory infection: Secondary | ICD-10-CM | POA: Diagnosis not present

## 2022-08-04 DIAGNOSIS — Z7951 Long term (current) use of inhaled steroids: Secondary | ICD-10-CM | POA: Diagnosis not present

## 2022-08-04 DIAGNOSIS — E782 Mixed hyperlipidemia: Secondary | ICD-10-CM | POA: Diagnosis not present

## 2022-08-06 DIAGNOSIS — J440 Chronic obstructive pulmonary disease with acute lower respiratory infection: Secondary | ICD-10-CM | POA: Diagnosis not present

## 2022-08-06 DIAGNOSIS — Z7984 Long term (current) use of oral hypoglycemic drugs: Secondary | ICD-10-CM | POA: Diagnosis not present

## 2022-08-06 DIAGNOSIS — E119 Type 2 diabetes mellitus without complications: Secondary | ICD-10-CM | POA: Diagnosis not present

## 2022-08-06 DIAGNOSIS — G8929 Other chronic pain: Secondary | ICD-10-CM | POA: Diagnosis not present

## 2022-08-06 DIAGNOSIS — Z7951 Long term (current) use of inhaled steroids: Secondary | ICD-10-CM | POA: Diagnosis not present

## 2022-08-06 DIAGNOSIS — Z8673 Personal history of transient ischemic attack (TIA), and cerebral infarction without residual deficits: Secondary | ICD-10-CM | POA: Diagnosis not present

## 2022-08-06 DIAGNOSIS — I951 Orthostatic hypotension: Secondary | ICD-10-CM | POA: Diagnosis not present

## 2022-08-06 DIAGNOSIS — Z993 Dependence on wheelchair: Secondary | ICD-10-CM | POA: Diagnosis not present

## 2022-08-06 DIAGNOSIS — N3000 Acute cystitis without hematuria: Secondary | ICD-10-CM | POA: Diagnosis not present

## 2022-08-06 DIAGNOSIS — M549 Dorsalgia, unspecified: Secondary | ICD-10-CM | POA: Diagnosis not present

## 2022-08-06 DIAGNOSIS — E871 Hypo-osmolality and hyponatremia: Secondary | ICD-10-CM | POA: Diagnosis not present

## 2022-08-06 DIAGNOSIS — J441 Chronic obstructive pulmonary disease with (acute) exacerbation: Secondary | ICD-10-CM | POA: Diagnosis not present

## 2022-08-06 DIAGNOSIS — I10 Essential (primary) hypertension: Secondary | ICD-10-CM | POA: Diagnosis not present

## 2022-08-06 DIAGNOSIS — F17210 Nicotine dependence, cigarettes, uncomplicated: Secondary | ICD-10-CM | POA: Diagnosis not present

## 2022-08-06 DIAGNOSIS — E46 Unspecified protein-calorie malnutrition: Secondary | ICD-10-CM | POA: Diagnosis not present

## 2022-08-06 DIAGNOSIS — E782 Mixed hyperlipidemia: Secondary | ICD-10-CM | POA: Diagnosis not present

## 2022-08-10 DIAGNOSIS — E119 Type 2 diabetes mellitus without complications: Secondary | ICD-10-CM | POA: Diagnosis not present

## 2022-08-10 DIAGNOSIS — Z993 Dependence on wheelchair: Secondary | ICD-10-CM | POA: Diagnosis not present

## 2022-08-10 DIAGNOSIS — I951 Orthostatic hypotension: Secondary | ICD-10-CM | POA: Diagnosis not present

## 2022-08-10 DIAGNOSIS — Z8673 Personal history of transient ischemic attack (TIA), and cerebral infarction without residual deficits: Secondary | ICD-10-CM | POA: Diagnosis not present

## 2022-08-10 DIAGNOSIS — I10 Essential (primary) hypertension: Secondary | ICD-10-CM | POA: Diagnosis not present

## 2022-08-10 DIAGNOSIS — F17210 Nicotine dependence, cigarettes, uncomplicated: Secondary | ICD-10-CM | POA: Diagnosis not present

## 2022-08-10 DIAGNOSIS — J441 Chronic obstructive pulmonary disease with (acute) exacerbation: Secondary | ICD-10-CM | POA: Diagnosis not present

## 2022-08-10 DIAGNOSIS — G8929 Other chronic pain: Secondary | ICD-10-CM | POA: Diagnosis not present

## 2022-08-10 DIAGNOSIS — N3000 Acute cystitis without hematuria: Secondary | ICD-10-CM | POA: Diagnosis not present

## 2022-08-10 DIAGNOSIS — Z7951 Long term (current) use of inhaled steroids: Secondary | ICD-10-CM | POA: Diagnosis not present

## 2022-08-10 DIAGNOSIS — J440 Chronic obstructive pulmonary disease with acute lower respiratory infection: Secondary | ICD-10-CM | POA: Diagnosis not present

## 2022-08-10 DIAGNOSIS — E46 Unspecified protein-calorie malnutrition: Secondary | ICD-10-CM | POA: Diagnosis not present

## 2022-08-10 DIAGNOSIS — E782 Mixed hyperlipidemia: Secondary | ICD-10-CM | POA: Diagnosis not present

## 2022-08-10 DIAGNOSIS — Z7984 Long term (current) use of oral hypoglycemic drugs: Secondary | ICD-10-CM | POA: Diagnosis not present

## 2022-08-10 DIAGNOSIS — E871 Hypo-osmolality and hyponatremia: Secondary | ICD-10-CM | POA: Diagnosis not present

## 2022-08-10 DIAGNOSIS — M549 Dorsalgia, unspecified: Secondary | ICD-10-CM | POA: Diagnosis not present

## 2022-08-11 DIAGNOSIS — J449 Chronic obstructive pulmonary disease, unspecified: Secondary | ICD-10-CM | POA: Diagnosis not present

## 2022-08-11 DIAGNOSIS — R059 Cough, unspecified: Secondary | ICD-10-CM | POA: Diagnosis not present

## 2022-08-11 DIAGNOSIS — R0600 Dyspnea, unspecified: Secondary | ICD-10-CM | POA: Diagnosis not present

## 2022-08-11 DIAGNOSIS — R1310 Dysphagia, unspecified: Secondary | ICD-10-CM | POA: Diagnosis not present

## 2022-08-11 DIAGNOSIS — E43 Unspecified severe protein-calorie malnutrition: Secondary | ICD-10-CM | POA: Diagnosis not present

## 2022-08-11 DIAGNOSIS — R64 Cachexia: Secondary | ICD-10-CM | POA: Diagnosis not present

## 2022-08-12 DIAGNOSIS — G8929 Other chronic pain: Secondary | ICD-10-CM | POA: Diagnosis not present

## 2022-08-12 DIAGNOSIS — J440 Chronic obstructive pulmonary disease with acute lower respiratory infection: Secondary | ICD-10-CM | POA: Diagnosis not present

## 2022-08-12 DIAGNOSIS — E119 Type 2 diabetes mellitus without complications: Secondary | ICD-10-CM | POA: Diagnosis not present

## 2022-08-12 DIAGNOSIS — E782 Mixed hyperlipidemia: Secondary | ICD-10-CM | POA: Diagnosis not present

## 2022-08-12 DIAGNOSIS — Z7984 Long term (current) use of oral hypoglycemic drugs: Secondary | ICD-10-CM | POA: Diagnosis not present

## 2022-08-12 DIAGNOSIS — Z8673 Personal history of transient ischemic attack (TIA), and cerebral infarction without residual deficits: Secondary | ICD-10-CM | POA: Diagnosis not present

## 2022-08-12 DIAGNOSIS — J441 Chronic obstructive pulmonary disease with (acute) exacerbation: Secondary | ICD-10-CM | POA: Diagnosis not present

## 2022-08-12 DIAGNOSIS — F17210 Nicotine dependence, cigarettes, uncomplicated: Secondary | ICD-10-CM | POA: Diagnosis not present

## 2022-08-12 DIAGNOSIS — N3000 Acute cystitis without hematuria: Secondary | ICD-10-CM | POA: Diagnosis not present

## 2022-08-12 DIAGNOSIS — E871 Hypo-osmolality and hyponatremia: Secondary | ICD-10-CM | POA: Diagnosis not present

## 2022-08-12 DIAGNOSIS — Z993 Dependence on wheelchair: Secondary | ICD-10-CM | POA: Diagnosis not present

## 2022-08-12 DIAGNOSIS — I10 Essential (primary) hypertension: Secondary | ICD-10-CM | POA: Diagnosis not present

## 2022-08-12 DIAGNOSIS — M549 Dorsalgia, unspecified: Secondary | ICD-10-CM | POA: Diagnosis not present

## 2022-08-12 DIAGNOSIS — E46 Unspecified protein-calorie malnutrition: Secondary | ICD-10-CM | POA: Diagnosis not present

## 2022-08-12 DIAGNOSIS — I951 Orthostatic hypotension: Secondary | ICD-10-CM | POA: Diagnosis not present

## 2022-08-12 DIAGNOSIS — Z7951 Long term (current) use of inhaled steroids: Secondary | ICD-10-CM | POA: Diagnosis not present

## 2022-08-13 DIAGNOSIS — Z993 Dependence on wheelchair: Secondary | ICD-10-CM | POA: Diagnosis not present

## 2022-08-13 DIAGNOSIS — E782 Mixed hyperlipidemia: Secondary | ICD-10-CM | POA: Diagnosis not present

## 2022-08-13 DIAGNOSIS — F17210 Nicotine dependence, cigarettes, uncomplicated: Secondary | ICD-10-CM | POA: Diagnosis not present

## 2022-08-13 DIAGNOSIS — I10 Essential (primary) hypertension: Secondary | ICD-10-CM | POA: Diagnosis not present

## 2022-08-13 DIAGNOSIS — N3000 Acute cystitis without hematuria: Secondary | ICD-10-CM | POA: Diagnosis not present

## 2022-08-13 DIAGNOSIS — E46 Unspecified protein-calorie malnutrition: Secondary | ICD-10-CM | POA: Diagnosis not present

## 2022-08-13 DIAGNOSIS — Z8673 Personal history of transient ischemic attack (TIA), and cerebral infarction without residual deficits: Secondary | ICD-10-CM | POA: Diagnosis not present

## 2022-08-13 DIAGNOSIS — I951 Orthostatic hypotension: Secondary | ICD-10-CM | POA: Diagnosis not present

## 2022-08-13 DIAGNOSIS — G8929 Other chronic pain: Secondary | ICD-10-CM | POA: Diagnosis not present

## 2022-08-13 DIAGNOSIS — E871 Hypo-osmolality and hyponatremia: Secondary | ICD-10-CM | POA: Diagnosis not present

## 2022-08-13 DIAGNOSIS — J440 Chronic obstructive pulmonary disease with acute lower respiratory infection: Secondary | ICD-10-CM | POA: Diagnosis not present

## 2022-08-13 DIAGNOSIS — M549 Dorsalgia, unspecified: Secondary | ICD-10-CM | POA: Diagnosis not present

## 2022-08-13 DIAGNOSIS — Z7951 Long term (current) use of inhaled steroids: Secondary | ICD-10-CM | POA: Diagnosis not present

## 2022-08-13 DIAGNOSIS — J441 Chronic obstructive pulmonary disease with (acute) exacerbation: Secondary | ICD-10-CM | POA: Diagnosis not present

## 2022-08-13 DIAGNOSIS — Z7984 Long term (current) use of oral hypoglycemic drugs: Secondary | ICD-10-CM | POA: Diagnosis not present

## 2022-08-13 DIAGNOSIS — E119 Type 2 diabetes mellitus without complications: Secondary | ICD-10-CM | POA: Diagnosis not present

## 2022-08-14 DIAGNOSIS — A419 Sepsis, unspecified organism: Secondary | ICD-10-CM | POA: Diagnosis not present

## 2022-08-14 DIAGNOSIS — I951 Orthostatic hypotension: Secondary | ICD-10-CM | POA: Diagnosis not present

## 2022-08-14 DIAGNOSIS — R55 Syncope and collapse: Secondary | ICD-10-CM | POA: Diagnosis not present

## 2022-08-14 DIAGNOSIS — E860 Dehydration: Secondary | ICD-10-CM | POA: Diagnosis not present

## 2022-08-14 DIAGNOSIS — U071 COVID-19: Secondary | ICD-10-CM | POA: Diagnosis not present

## 2022-08-19 DIAGNOSIS — E43 Unspecified severe protein-calorie malnutrition: Secondary | ICD-10-CM | POA: Diagnosis not present

## 2022-08-19 DIAGNOSIS — N390 Urinary tract infection, site not specified: Secondary | ICD-10-CM | POA: Diagnosis not present

## 2022-08-19 DIAGNOSIS — Z7951 Long term (current) use of inhaled steroids: Secondary | ICD-10-CM | POA: Diagnosis not present

## 2022-08-19 DIAGNOSIS — R7989 Other specified abnormal findings of blood chemistry: Secondary | ICD-10-CM | POA: Diagnosis not present

## 2022-08-19 DIAGNOSIS — Z66 Do not resuscitate: Secondary | ICD-10-CM | POA: Diagnosis not present

## 2022-08-19 DIAGNOSIS — R6889 Other general symptoms and signs: Secondary | ICD-10-CM | POA: Diagnosis not present

## 2022-08-19 DIAGNOSIS — J441 Chronic obstructive pulmonary disease with (acute) exacerbation: Secondary | ICD-10-CM | POA: Diagnosis not present

## 2022-08-19 DIAGNOSIS — E785 Hyperlipidemia, unspecified: Secondary | ICD-10-CM | POA: Diagnosis not present

## 2022-08-19 DIAGNOSIS — R54 Age-related physical debility: Secondary | ICD-10-CM | POA: Diagnosis not present

## 2022-08-19 DIAGNOSIS — G8929 Other chronic pain: Secondary | ICD-10-CM | POA: Diagnosis not present

## 2022-08-19 DIAGNOSIS — R8271 Bacteriuria: Secondary | ICD-10-CM | POA: Diagnosis not present

## 2022-08-19 DIAGNOSIS — I10 Essential (primary) hypertension: Secondary | ICD-10-CM | POA: Diagnosis not present

## 2022-08-19 DIAGNOSIS — N3001 Acute cystitis with hematuria: Secondary | ICD-10-CM | POA: Diagnosis not present

## 2022-08-19 DIAGNOSIS — Z7984 Long term (current) use of oral hypoglycemic drugs: Secondary | ICD-10-CM | POA: Diagnosis not present

## 2022-08-19 DIAGNOSIS — B952 Enterococcus as the cause of diseases classified elsewhere: Secondary | ICD-10-CM | POA: Diagnosis not present

## 2022-08-19 DIAGNOSIS — Z792 Long term (current) use of antibiotics: Secondary | ICD-10-CM | POA: Diagnosis not present

## 2022-08-19 DIAGNOSIS — D539 Nutritional anemia, unspecified: Secondary | ICD-10-CM | POA: Diagnosis not present

## 2022-08-19 DIAGNOSIS — Z515 Encounter for palliative care: Secondary | ICD-10-CM | POA: Diagnosis not present

## 2022-08-19 DIAGNOSIS — I499 Cardiac arrhythmia, unspecified: Secondary | ICD-10-CM | POA: Diagnosis not present

## 2022-08-19 DIAGNOSIS — Z9989 Dependence on other enabling machines and devices: Secondary | ICD-10-CM | POA: Diagnosis not present

## 2022-08-19 DIAGNOSIS — Z681 Body mass index (BMI) 19 or less, adult: Secondary | ICD-10-CM | POA: Diagnosis not present

## 2022-08-19 DIAGNOSIS — Z743 Need for continuous supervision: Secondary | ICD-10-CM | POA: Diagnosis not present

## 2022-08-19 DIAGNOSIS — Z1152 Encounter for screening for COVID-19: Secondary | ICD-10-CM | POA: Diagnosis not present

## 2022-08-19 DIAGNOSIS — J9601 Acute respiratory failure with hypoxia: Secondary | ICD-10-CM | POA: Diagnosis not present

## 2022-08-19 DIAGNOSIS — Z7401 Bed confinement status: Secondary | ICD-10-CM | POA: Diagnosis not present

## 2022-08-19 DIAGNOSIS — R6520 Severe sepsis without septic shock: Secondary | ICD-10-CM | POA: Diagnosis not present

## 2022-08-19 DIAGNOSIS — J690 Pneumonitis due to inhalation of food and vomit: Secondary | ICD-10-CM | POA: Diagnosis not present

## 2022-08-19 DIAGNOSIS — F17210 Nicotine dependence, cigarettes, uncomplicated: Secondary | ICD-10-CM | POA: Diagnosis not present

## 2022-08-19 DIAGNOSIS — M25551 Pain in right hip: Secondary | ICD-10-CM | POA: Diagnosis not present

## 2022-08-19 DIAGNOSIS — M545 Low back pain, unspecified: Secondary | ICD-10-CM | POA: Diagnosis not present

## 2022-08-19 DIAGNOSIS — E119 Type 2 diabetes mellitus without complications: Secondary | ICD-10-CM | POA: Diagnosis not present

## 2022-08-19 DIAGNOSIS — R0902 Hypoxemia: Secondary | ICD-10-CM | POA: Diagnosis not present

## 2022-08-19 DIAGNOSIS — D72829 Elevated white blood cell count, unspecified: Secondary | ICD-10-CM | POA: Diagnosis not present

## 2022-08-19 DIAGNOSIS — A419 Sepsis, unspecified organism: Secondary | ICD-10-CM | POA: Diagnosis not present

## 2022-08-19 DIAGNOSIS — J9690 Respiratory failure, unspecified, unspecified whether with hypoxia or hypercapnia: Secondary | ICD-10-CM | POA: Diagnosis not present

## 2022-08-19 DIAGNOSIS — R404 Transient alteration of awareness: Secondary | ICD-10-CM | POA: Diagnosis not present

## 2022-08-19 DIAGNOSIS — R0602 Shortness of breath: Secondary | ICD-10-CM | POA: Diagnosis not present

## 2022-08-31 DEATH — deceased
